# Patient Record
Sex: Female | Born: 1970 | Race: White | Hispanic: No | Marital: Married | Smoking: Never smoker
Health system: Southern US, Community
[De-identification: ages and names within clinical notes are randomized; demographics above are authoritative.]

## PROBLEM LIST (undated history)

## (undated) DIAGNOSIS — D509 Iron deficiency anemia, unspecified: Secondary | ICD-10-CM

## (undated) DIAGNOSIS — Z8619 Personal history of other infectious and parasitic diseases: Secondary | ICD-10-CM

## (undated) DIAGNOSIS — M1990 Unspecified osteoarthritis, unspecified site: Secondary | ICD-10-CM

## (undated) DIAGNOSIS — E7800 Pure hypercholesterolemia, unspecified: Secondary | ICD-10-CM

## (undated) DIAGNOSIS — I10 Essential (primary) hypertension: Secondary | ICD-10-CM

## (undated) DIAGNOSIS — T7840XA Allergy, unspecified, initial encounter: Secondary | ICD-10-CM

## (undated) HISTORY — DX: Iron deficiency anemia, unspecified: D50.9

## (undated) HISTORY — DX: Allergy, unspecified, initial encounter: T78.40XA

## (undated) HISTORY — DX: Essential (primary) hypertension: I10

## (undated) HISTORY — DX: Personal history of other infectious and parasitic diseases: Z86.19

---

## 2010-09-29 HISTORY — PX: KNEE SURGERY: SHX244

## 2018-07-23 LAB — HM MAMMOGRAPHY

## 2018-10-04 LAB — TSH: TSH: 2.53 (ref 0.41–5.90)

## 2018-10-04 LAB — COMPREHENSIVE METABOLIC PANEL
Albumin: 4.7 (ref 3.5–5.0)
Calcium: 9.5 (ref 8.7–10.7)

## 2018-10-04 LAB — BASIC METABOLIC PANEL
BUN: 13 (ref 4–21)
CO2: 23 — AB (ref 13–22)
Chloride: 106 (ref 99–108)
Creatinine: 0.8 (ref 0.5–1.1)
Glucose: 94
Potassium: 4.8 (ref 3.4–5.3)
Sodium: 143 (ref 137–147)

## 2018-10-04 LAB — CBC AND DIFFERENTIAL
HCT: 43 (ref 36–46)
Hemoglobin: 13.8 (ref 12.0–16.0)
WBC: 5.9

## 2018-10-04 LAB — LIPID PANEL
Cholesterol: 239 — AB (ref 0–200)
HDL: 64 (ref 35–70)
LDL Cholesterol: 147
Triglycerides: 142 (ref 40–160)

## 2018-10-04 LAB — CBC: RBC: 4.48 (ref 3.87–5.11)

## 2018-10-04 LAB — VITAMIN B12: Vitamin B-12: 249

## 2020-08-07 ENCOUNTER — Other Ambulatory Visit: Payer: Self-pay

## 2020-08-07 ENCOUNTER — Encounter: Payer: Self-pay | Admitting: Physician Assistant

## 2020-08-07 ENCOUNTER — Ambulatory Visit (INDEPENDENT_AMBULATORY_CARE_PROVIDER_SITE_OTHER): Payer: BC Managed Care – PPO | Admitting: Physician Assistant

## 2020-08-07 VITALS — BP 120/84 | HR 67 | Temp 97.5°F | Ht 63.5 in | Wt 211.0 lb

## 2020-08-07 DIAGNOSIS — I10 Essential (primary) hypertension: Secondary | ICD-10-CM

## 2020-08-07 DIAGNOSIS — Z23 Encounter for immunization: Secondary | ICD-10-CM

## 2020-08-07 DIAGNOSIS — R1011 Right upper quadrant pain: Secondary | ICD-10-CM

## 2020-08-07 LAB — CBC WITH DIFFERENTIAL/PLATELET
Absolute Monocytes: 340 cells/uL (ref 200–950)
Basophils Absolute: 32 cells/uL (ref 0–200)
Basophils Relative: 0.5 %
Eosinophils Absolute: 151 cells/uL (ref 15–500)
Eosinophils Relative: 2.4 %
HCT: 42.7 % (ref 35.0–45.0)
Hemoglobin: 14.4 g/dL (ref 11.7–15.5)
Lymphs Abs: 2073 cells/uL (ref 850–3900)
MCH: 30.8 pg (ref 27.0–33.0)
MCHC: 33.7 g/dL (ref 32.0–36.0)
MCV: 91.2 fL (ref 80.0–100.0)
MPV: 9.7 fL (ref 7.5–12.5)
Monocytes Relative: 5.4 %
Neutro Abs: 3704 cells/uL (ref 1500–7800)
Neutrophils Relative %: 58.8 %
Platelets: 270 10*3/uL (ref 140–400)
RBC: 4.68 10*6/uL (ref 3.80–5.10)
RDW: 12.2 % (ref 11.0–15.0)
Total Lymphocyte: 32.9 %
WBC: 6.3 10*3/uL (ref 3.8–10.8)

## 2020-08-07 LAB — LIPASE: Lipase: 20 U/L (ref 7–60)

## 2020-08-07 LAB — COMPREHENSIVE METABOLIC PANEL
AG Ratio: 1.9 (calc) (ref 1.0–2.5)
ALT: 15 U/L (ref 6–29)
AST: 15 U/L (ref 10–35)
Albumin: 4.5 g/dL (ref 3.6–5.1)
Alkaline phosphatase (APISO): 51 U/L (ref 31–125)
BUN: 14 mg/dL (ref 7–25)
CO2: 27 mmol/L (ref 20–32)
Calcium: 9.6 mg/dL (ref 8.6–10.2)
Chloride: 105 mmol/L (ref 98–110)
Creat: 0.86 mg/dL (ref 0.50–1.10)
Globulin: 2.4 g/dL (calc) (ref 1.9–3.7)
Glucose, Bld: 87 mg/dL (ref 65–99)
Potassium: 4.2 mmol/L (ref 3.5–5.3)
Sodium: 139 mmol/L (ref 135–146)
Total Bilirubin: 1 mg/dL (ref 0.2–1.2)
Total Protein: 6.9 g/dL (ref 6.1–8.1)

## 2020-08-07 MED ORDER — LISINOPRIL 40 MG PO TABS: 40.0000 mg | ORAL_TABLET | Freq: Every day | ORAL | 3 refills | Status: DC

## 2020-08-07 MED ORDER — HYDROCHLOROTHIAZIDE 12.5 MG PO CAPS: 12.5000 mg | ORAL_CAPSULE | Freq: Every day | ORAL | 3 refills | Status: DC

## 2020-08-07 NOTE — Progress Notes (Signed)
Christine Webb is a 49 y.o. female is here to establish care.  I acted as a Neurosurgeon for [employer], PA-C Corky Mull, LPN   History of Present Illness:   Chief Complaint  Patient presents with  . Establish Care  . Abdominal Pain    RUQ    HPI   Abdominal pain Pt c/o RUQ abdominal pain for years. States that if she laughs really hard, bends over a certain way, she has pain in that area. She said that she discussed this with her doctor in the past but they were unable to find the source of pain. She has not had any imaging done. Over the past 6 months, she has had a burning sensation in that area. Denies: radiation, no nausea or constipation. Does have heartburn related to foods that she eats, and this is remedied by changing diet and eating tums. Does not require other medications for her heartburn. Has had history of colonoscopy, family hx of colon cancer. The burning sensation lasts for a few minutes, not associated with eating.  HTN Currently taking Lisinopril 40 mg and HCTZ 12.5 mg. At home blood pressure readings are: not checked. Patient denies chest pain, SOB, blurred vision, dizziness, unusual headaches, lower leg swelling. Patient is compliant with medication. Denies excessive caffeine intake, stimulant usage, excessive alcohol intake, or increase in salt consumption.  BP Readings from Last 3 Encounters:  08/07/20 120/84      Health Maintenance Due  Topic Date Due  . Hepatitis C Screening  Never done  . HIV Screening  Never done  . TETANUS/TDAP  Never done  . PAP SMEAR-Modifier  Never done  . INFLUENZA VACCINE  Never done    Past Medical History:  Diagnosis Date  . Allergy   . History of chicken pox   . History of shingles   . Hypertension   . Iron deficiency anemia    Ablation; did require transfusions     Social History   Tobacco Use  . Smoking status: Never Smoker  . Smokeless tobacco: Never Used  Vaping Use  . Vaping Use: Never used   Substance Use Topics  . Alcohol use: Yes    Alcohol/week: 3.0 standard drinks    Types: 3 Glasses of wine per week  . Drug use: Never    Past Surgical History:  Procedure Laterality Date  . CESAREAN SECTION  2002, 2004  . KNEE SURGERY  2012   3 surgeries    Family History  Problem Relation Age of Onset  . Hypertension Mother   . Breast cancer Mother   . Colon cancer Mother   . Osteoarthritis Father   . Hypertension Father   . Skin cancer Brother   . Lung cancer Maternal Grandmother   . Kidney cancer Paternal Grandmother     PMHx, SurgHx, SocialHx, FamHx, Medications, and Allergies were reviewed in the Visit Navigator and updated as appropriate.   Patient Active Problem List   Diagnosis Date Noted  . Hypertension     Social History   Tobacco Use  . Smoking status: Never Smoker  . Smokeless tobacco: Never Used  Vaping Use  . Vaping Use: Never used  Substance Use Topics  . Alcohol use: Yes    Alcohol/week: 3.0 standard drinks    Types: 3 Glasses of wine per week  . Drug use: Never    Current Medications and Allergies:    Current Outpatient Medications:  .  cetirizine (ZYRTEC) 10 MG tablet, Take 10  mg by mouth daily., Disp: , Rfl:  .  hydrochlorothiazide (MICROZIDE) 12.5 MG capsule, Take 1 capsule (12.5 mg total) by mouth daily., Disp: 90 capsule, Rfl: 3 .  lisinopril (ZESTRIL) 40 MG tablet, Take 1 tablet (40 mg total) by mouth daily., Disp: 90 tablet, Rfl: 3  No Known Allergies  Review of Systems   ROS Negative unless otherwise specified per HPI.  Vitals:   Vitals:   08/07/20 0824  BP: 120/84  Pulse: 67  Temp: (!) 97.5 F (36.4 C)  TempSrc: Temporal  SpO2: 97%  Weight: 211 lb (95.7 kg)  Height: 5' 3.5" (1.613 m)     Body mass index is 36.79 kg/m.   Physical Exam:    Physical Exam Vitals and nursing note reviewed.  Constitutional:      General: She is not in acute distress.    Appearance: She is well-developed. She is not  ill-appearing or toxic-appearing.  Cardiovascular:     Rate and Rhythm: Normal rate and regular rhythm.     Pulses: Normal pulses.     Heart sounds: Normal heart sounds, S1 normal and S2 normal.     Comments: No LE edema Pulmonary:     Effort: Pulmonary effort is normal.     Breath sounds: Normal breath sounds.  Abdominal:     General: Abdomen is flat. Bowel sounds are normal.     Palpations: Abdomen is soft.     Tenderness: There is no abdominal tenderness. There is no right CVA tenderness or left CVA tenderness.  Skin:    General: Skin is warm and dry.  Neurological:     Mental Status: She is alert.     GCS: GCS eye subscore is 4. GCS verbal subscore is 5. GCS motor subscore is 6.  Psychiatric:        Speech: Speech normal.        Behavior: Behavior normal. Behavior is cooperative.      Assessment and Plan:    [name] was seen today for establish care and abdominal pain.  Diagnoses and all orders for this visit:  Colicky RUQ abdominal pain No red flags on exam. Unclear etiology -- will obtain imaging to further work-up. Possible gallbladder etiology? Will update labs today as well. Follow-up based on results and clinical response. -     CBC with Differential/Platelet; Future -     Comprehensive metabolic panel; Future -     Lipase; Future -     US ABDOMEN LIMITED RUQ (LIVER/GB); Future -     Lipase -     Comprehensive metabolic panel -     CBC with Differential/Platelet  Primary hypertension Well controlled. Will refill lisinopril 40 mg and hctz 12.5 mg. Follow-up in 6 months.  Other orders -     hydrochlorothiazide (MICROZIDE) 12.5 MG capsule; Take 1 capsule (12.5 mg total) by mouth daily. -     lisinopril (ZESTRIL) 40 MG tablet; Take 1 tablet (40 mg total) by mouth daily.  CMA or LPN served as scribe during this visit. History, Physical, and Plan performed by medical provider. The above documentation has been reviewed and is accurate and  complete.   Jarold Motto, PA-C [place] 08/07/2020  Follow-up: No follow-ups on file.

## 2020-08-07 NOTE — Addendum Note (Signed)
Addended by: Jimmye Norman on: 08/07/2020 11:25 AM   Modules accepted: Orders

## 2020-08-07 NOTE — Patient Instructions (Signed)
It was great to see you!  We are going to update your labs today.  We are also going to get you scheduled for an abdominal ultrasound.  After these results are in we will discuss next steps and follow-up.  Take care,  Jarold Motto PA-C

## 2020-08-10 ENCOUNTER — Telehealth: Payer: Self-pay

## 2020-08-10 NOTE — Telephone Encounter (Signed)
See result notes. 

## 2020-08-10 NOTE — Telephone Encounter (Signed)
Patient called back regarding lab results  

## 2020-08-29 ENCOUNTER — Other Ambulatory Visit: Payer: BC Managed Care – PPO

## 2020-08-30 ENCOUNTER — Ambulatory Visit
Admission: RE | Admit: 2020-08-30 | Discharge: 2020-08-30 | Disposition: A | Discharge status: Home / Self Care | Payer: BC Managed Care – PPO | Source: Ambulatory Visit | Attending: Physician Assistant | Admitting: Physician Assistant

## 2020-08-30 DIAGNOSIS — R1011 Right upper quadrant pain: Secondary | ICD-10-CM

## 2020-08-30 DIAGNOSIS — K8020 Calculus of gallbladder without cholecystitis without obstruction: Secondary | ICD-10-CM | POA: Diagnosis not present

## 2020-09-03 ENCOUNTER — Telehealth: Payer: Self-pay

## 2020-09-03 NOTE — Telephone Encounter (Signed)
Pt would like to know if someone could call her about the labs she was taken at the imaging center. She has an appt to discuss them, but wants to know if she needs to jump on this sooner.

## 2020-09-04 NOTE — Telephone Encounter (Signed)
Appt scheduled to discuss results of U/S.

## 2020-09-11 ENCOUNTER — Other Ambulatory Visit: Payer: Self-pay

## 2020-09-11 ENCOUNTER — Ambulatory Visit (INDEPENDENT_AMBULATORY_CARE_PROVIDER_SITE_OTHER): Payer: BC Managed Care – PPO | Admitting: Physician Assistant

## 2020-09-11 ENCOUNTER — Encounter: Payer: Self-pay | Admitting: Physician Assistant

## 2020-09-11 VITALS — BP 120/80 | HR 65 | Temp 97.4°F | Ht 63.5 in | Wt 211.0 lb

## 2020-09-11 DIAGNOSIS — R1011 Right upper quadrant pain: Secondary | ICD-10-CM

## 2020-09-11 DIAGNOSIS — T753XXA Motion sickness, initial encounter: Secondary | ICD-10-CM

## 2020-09-11 MED ORDER — MECLIZINE HCL 12.5 MG PO TABS: 12.5000 mg | ORAL_TABLET | Freq: Three times a day (TID) | ORAL | 0 refills | Status: AC | PRN

## 2020-09-11 MED ORDER — ONDANSETRON HCL 4 MG PO TABS: 4.0000 mg | ORAL_TABLET | Freq: Three times a day (TID) | ORAL | 1 refills | Status: DC | PRN

## 2020-09-11 MED ORDER — SCOPOLAMINE 1 MG/3DAYS TD PT72: 1.0000 | MEDICATED_PATCH | TRANSDERMAL | 0 refills | Status: DC

## 2020-09-11 NOTE — Progress Notes (Signed)
Christine Webb is a 49 y.o. female is here to discuss: Ultrasound results  I acted as a Neurosurgeon for [employer], PA-C Corky Mull, LPN   History of Present Illness:   Chief Complaint  Patient presents with  . Ultrasound results  . Abdominal Pain    RUQ    HPI   RUQ pain Last seen by me on 08/07/20 for colicky RUQ pain. Labs were normal but U/S revealed cholelithiasis and fatty infiltration of liver.  She had an episode this past Saturday, she was having RUQ pain radiating to mid back area and lasted for 6 hours. Pain was severe. Had nausea but no vomiting. She was eating "unhealthy" food while watching football.  She denies: fevers, chills, malaise, bowel changes, rectal bleeding  Motion-Sickness She is planning to go on a cruise within the next two weeks. She has hx of motion sickness and would like meclizine in case her symptoms flare up.    Health Maintenance Due  Topic Date Due  . Hepatitis C Screening  Never done  . HIV Screening  Never done  . PAP SMEAR-Modifier  Never done    Past Medical History:  Diagnosis Date  . Allergy   . History of chicken pox   . History of shingles   . Hypertension   . Iron deficiency anemia    Ablation; did require transfusions     Social History   Tobacco Use  . Smoking status: Never Smoker  . Smokeless tobacco: Never Used  Vaping Use  . Vaping Use: Never used  Substance Use Topics  . Alcohol use: Yes    Alcohol/week: 3.0 standard drinks    Types: 3 Glasses of wine per week  . Drug use: Never    Past Surgical History:  Procedure Laterality Date  . CESAREAN SECTION  2002, 2004  . KNEE SURGERY  2012   3 surgeries    Family History  Problem Relation Age of Onset  . Hypertension Mother   . Breast cancer Mother   . Colon cancer Mother   . Osteoarthritis Father   . Hypertension Father   . Skin cancer Brother   . Lung cancer Maternal Grandmother   . Kidney cancer Paternal Grandmother     PMHx, SurgHx,  SocialHx, FamHx, Medications, and Allergies were reviewed in the Visit Navigator and updated as appropriate.   Patient Active Problem List   Diagnosis Date Noted  . Hypertension     Social History   Tobacco Use  . Smoking status: Never Smoker  . Smokeless tobacco: Never Used  Vaping Use  . Vaping Use: Never used  Substance Use Topics  . Alcohol use: Yes    Alcohol/week: 3.0 standard drinks    Types: 3 Glasses of wine per week  . Drug use: Never    Current Medications and Allergies:    Current Outpatient Medications:  .  cetirizine (ZYRTEC) 10 MG tablet, Take 10 mg by mouth daily., Disp: , Rfl:  .  hydrochlorothiazide (MICROZIDE) 12.5 MG capsule, Take 1 capsule (12.5 mg total) by mouth daily., Disp: 90 capsule, Rfl: 3 .  lisinopril (ZESTRIL) 40 MG tablet, Take 1 tablet (40 mg total) by mouth daily., Disp: 90 tablet, Rfl: 3 .  meclizine (ANTIVERT) 12.5 MG tablet, Take 1 tablet (12.5 mg total) by mouth 3 (three) times daily as needed for dizziness., Disp: 30 tablet, Rfl: 0 .  ondansetron (ZOFRAN) 4 MG tablet, Take 1 tablet (4 mg total) by mouth every 8 (eight)  hours as needed for nausea or vomiting., Disp: 30 tablet, Rfl: 1 .  scopolamine (TRANSDERM-SCOP) 1 MG/3DAYS, Place 1 patch (1.5 mg total) onto the skin every 3 (three) days. Start >4 hours prior to event., Disp: 10 patch, Rfl: 0  No Known Allergies  Review of Systems   ROS Negative unless otherwise specified per HPI.  Vitals:   Vitals:   09/11/20 1408  BP: 120/80  Pulse: 65  Temp: (!) 97.4 F (36.3 C)  TempSrc: Temporal  SpO2: 97%  Weight: 211 lb (95.7 kg)  Height: 5' 3.5" (1.613 m)     Body mass index is 36.79 kg/m.   Physical Exam:    Physical Exam Vitals and nursing note reviewed.  Constitutional:      General: She is not in acute distress.    Appearance: She is well-developed. She is not ill-appearing, toxic-appearing or sickly-appearing.  Cardiovascular:     Rate and Rhythm: Normal rate and  regular rhythm.     Pulses: Normal pulses.     Heart sounds: Normal heart sounds, S1 normal and S2 normal.     Comments: No LE edema Pulmonary:     Effort: Pulmonary effort is normal.     Breath sounds: Normal breath sounds.  Skin:    General: Skin is warm, dry and intact.  Neurological:     Mental Status: She is alert.     GCS: GCS eye subscore is 4. GCS verbal subscore is 5. GCS motor subscore is 6.  Psychiatric:        Mood and Affect: Mood and affect normal.        Speech: Speech normal.        Behavior: Behavior normal. Behavior is cooperative.      Assessment and Plan:    [name] was seen today for ultrasound results and abdominal pain.  Diagnoses and all orders for this visit:  Colicky RUQ abdominal pain Given ongoing intermittent symptoms, will refer to [place] [facility] for  Possible elective gallbladder removal. I have given her zofran to use prn for her trip. Recommend low-fat diet. Worsening precautions advised.  Motion sickness, initial encounter I have prescribed both scopolamine patches and meclizine, depending on which one her insurance pays for.  Other orders -     ondansetron (ZOFRAN) 4 MG tablet; Take 1 tablet (4 mg total) by mouth every 8 (eight) hours as needed for nausea or vomiting. -     scopolamine (TRANSDERM-SCOP) 1 MG/3DAYS; Place 1 patch (1.5 mg total) onto the skin every 3 (three) days. Start >4 hours prior to event. -     meclizine (ANTIVERT) 12.5 MG tablet; Take 1 tablet (12.5 mg total) by mouth 3 (three) times daily as needed for dizziness.  CMA or LPN served as scribe during this visit. History, Physical, and Plan performed by medical provider. The above documentation has been reviewed and is accurate and complete.   Jarold Motto, PA-C [place] 09/11/2020  Follow-up: No follow-ups on file.

## 2020-09-11 NOTE — Patient Instructions (Addendum)
It was great to see you!  I have sent in nausea medication for you as well as dizziness patches and meclizine should you need it for your trip.  Call [facility]: [phone] -- call if you haven't heard anything in the next few weeks.   Gallbladder Eating Plan If you have a gallbladder condition, you may have trouble digesting fats. Eating a low-fat diet can help reduce your symptoms, and may be helpful before and after having surgery to remove your gallbladder (cholecystectomy). Your health care provider may recommend that you work with a diet and nutrition specialist (dietitian) to help you reduce the amount of fat in your diet. What are tips for following this plan? General guidelines  Limit your fat intake to less than 30% of your total daily calories. If you eat around 1,800 calories each day, this is less than 60 grams (g) of fat per day.  Fat is an important part of a healthy diet. Eating a low-fat diet can make it hard to maintain a healthy body weight. Ask your dietitian how much fat, calories, and other nutrients you need each day.  Eat small, frequent meals throughout the day instead of three large meals.  Drink at least 8-10 cups of fluid a day. Drink enough fluid to keep your urine clear or pale yellow.  Limit alcohol intake to no more than 1 drink a day for nonpregnant women and 2 drinks a day for men. One drink equals 12 oz of beer, 5 oz of wine, or 1 oz of hard liquor. Reading food labels  Check Nutrition Facts on food labels for the amount of fat per serving. Choose foods with less than 3 grams of fat per serving. Shopping  Choose nonfat and low-fat healthy foods. Look for the words "nonfat," "low fat," or "fat free."  Avoid buying processed or prepackaged foods. Cooking  Cook using low-fat methods, such as baking, broiling, grilling, or boiling.  Cook with small amounts of healthy fats, such as olive oil, grapeseed oil, canola oil, or sunflower  oil. What foods are recommended?   All fresh, frozen, or canned fruits and vegetables.  Whole grains.  Low-fat or non-fat (skim) milk and yogurt.  Lean meat, skinless poultry, fish, eggs, and beans.  Low-fat protein supplement powders or drinks.  Spices and herbs. What foods are not recommended?  High-fat foods. These include baked goods, fast food, fatty cuts of meat, ice cream, french toast, sweet rolls, pizza, cheese bread, foods covered with butter, creamy sauces, or cheese.  Fried foods. These include french fries, tempura, battered fish, breaded chicken, fried breads, and sweets.  Foods with strong odors.  Foods that cause bloating and gas. Summary  A low-fat diet can be helpful if you have a gallbladder condition, or before and after gallbladder surgery.  Limit your fat intake to less than 30% of your total daily calories. This is about 60 g of fat if you eat 1,800 calories each day.  Eat small, frequent meals throughout the day instead of three large meals.  Contact a doctor if: 1. You have sudden pain in the upper right side of your belly (abdomen). Pain might spread to your right shoulder or your chest. This may be a sign of a gallbladder attack. 2. You feel sick to your stomach (are nauseous). 3. You throw up (vomit). 4. You have been diagnosed with gallstones that have no symptoms and you get: ? Belly pain. ? Discomfort, burning, or fullness in the upper  part of your belly (indigestion). Get help right away if:  You have sudden pain in the upper right side of your belly, and it lasts for more than 2 hours.  You have belly pain that lasts for more than 5 hours.  You have a fever or chills.  You keep feeling sick to your stomach or you keep throwing up.  Your skin or the whites of your eyes turn yellow (jaundice).  You have dark-colored pee (urine).  You have light-colored poop (stool).

## 2020-09-14 ENCOUNTER — Encounter: Payer: Self-pay | Admitting: Physician Assistant

## 2020-10-31 ENCOUNTER — Ambulatory Visit: Payer: Self-pay | Admitting: Surgery

## 2020-10-31 DIAGNOSIS — K8010 Calculus of gallbladder with chronic cholecystitis without obstruction: Secondary | ICD-10-CM | POA: Diagnosis not present

## 2020-11-29 NOTE — Patient Instructions (Addendum)
DUE TO COVID-19 ONLY ONE VISITOR IS ALLOWED TO COME WITH YOU AND STAY IN THE WAITING ROOM ONLY DURING PRE OP AND PROCEDURE DAY OF SURGERY. THE 1 VISITOR  MAY VISIT WITH YOU AFTER SURGERY IN YOUR PRIVATE ROOM DURING VISITING HOURS ONLY!  YOU NEED TO HAVE A COVID 19 TEST ON: 12/05/20 @  3:05 PM , THIS TEST MUST BE DONE BEFORE SURGERY,  COVID TESTING SITE [address] [place] [address], IT IS ON THE RIGHT GOING OUT [address] APPROXIMATELY  2 MINUTES PAST [employer] ON THE RIGHT. ONCE YOUR COVID TEST IS COMPLETED,  PLEASE BEGIN THE QUARANTINE INSTRUCTIONS AS OUTLINED IN YOUR HANDOUT.                Nilah Belcourt   Your procedure is scheduled on: 12/07/20   Report to [facility] Main  Entrance   Report to short stay at: 5:30 AM     Call this number if you have problems the morning of surgery [phone]    Remember: Do not eat food or drink liquids :After Midnight.   BRUSH YOUR TEETH MORNING OF SURGERY AND RINSE YOUR MOUTH OUT, NO CHEWING GUM CANDY OR MINTS.    Take these medicines the morning of surgery with A SIP OF WATER: cetirizine as needed.                                You may not have any metal on your body including hair pins and              piercings  Do not wear jewelry, make-up, lotions, powders or perfumes, deodorant             Do not wear nail polish on your fingernails.  Do not shave  48 hours prior to surgery.            Do not bring valuables to the hospital. [facility] IS NOT             RESPONSIBLE   FOR VALUABLES.  Contacts, dentures or bridgework may not be worn into surgery.  Leave suitcase in the car. After surgery it may be brought to your room.     Patients discharged the day of surgery will not be allowed to drive home. IF YOU ARE HAVING SURGERY AND GOING HOME THE SAME DAY, YOU MUST HAVE AN ADULT TO DRIVE YOU HOME AND BE WITH YOU FOR 24 HOURS. YOU MAY GO HOME BY TAXI OR UBER OR ORTHERWISE, BUT AN ADULT MUST ACCOMPANY YOU  HOME AND STAY WITH YOU FOR 24 HOURS.  Name and phone number of your driver:  Special Instructions: N/A              Please read over the following fact sheets you were given: _____________________________________________________________________        [facility] - Preparing for Surgery Before surgery, you can play an important role.  Because skin is not sterile, your skin needs to be as free of germs as possible.  You can reduce the number of germs on your skin by washing with CHG (chlorahexidine gluconate) soap before surgery.  CHG is an antiseptic cleaner which kills germs and bonds with the skin to continue killing germs even after washing. Please DO NOT use if you have an allergy to CHG or antibacterial soaps.  If your skin becomes reddened/irritated stop using the CHG and inform  your nurse when you arrive at Short Stay. Do not shave (including legs and underarms) for at least 48 hours prior to the first CHG shower.  You may shave your face/neck. Please follow these instructions carefully:  1.  Shower with CHG Soap the night before surgery and the  morning of Surgery.  2.  If you choose to wash your hair, wash your hair first as usual with your  normal  shampoo.  3.  After you shampoo, rinse your hair and body thoroughly to remove the  shampoo.                           4.  Use CHG as you would any other liquid soap.  You can apply chg directly  to the skin and wash                       Gently with a scrungie or clean washcloth.  5.  Apply the CHG Soap to your body ONLY FROM THE NECK DOWN.   Do not use on face/ open                           Wound or open sores. Avoid contact with eyes, ears mouth and genitals (private parts).                       Wash face,  Genitals (private parts) with your normal soap.             6.  Wash thoroughly, paying special attention to the area where your surgery  will be performed.  7.  Thoroughly rinse your body with warm water from the neck down.  8.  DO  NOT shower/wash with your normal soap after using and rinsing off  the CHG Soap.                9.  Pat yourself dry with a clean towel.            10.  Wear clean pajamas.            11.  Place clean sheets on your bed the night of your first shower and do not  sleep with pets. Day of Surgery : Do not apply any lotions/deodorants the morning of surgery.  Please wear clean clothes to the hospital/surgery center.  FAILURE TO FOLLOW THESE INSTRUCTIONS MAY RESULT IN THE CANCELLATION OF YOUR SURGERY PATIENT SIGNATURE_________________________________  NURSE SIGNATURE__________________________________  ________________________________________________________________________

## 2020-11-30 ENCOUNTER — Encounter (HOSPITAL_COMMUNITY): Payer: Self-pay

## 2020-11-30 ENCOUNTER — Other Ambulatory Visit: Payer: Self-pay

## 2020-11-30 ENCOUNTER — Encounter (HOSPITAL_COMMUNITY)
Admission: RE | Admit: 2020-11-30 | Discharge: 2020-11-30 | Disposition: A | Discharge status: Home / Self Care | Payer: BC Managed Care – PPO | Source: Ambulatory Visit | Attending: Surgery | Admitting: Surgery

## 2020-11-30 DIAGNOSIS — Z01818 Encounter for other preprocedural examination: Secondary | ICD-10-CM | POA: Insufficient documentation

## 2020-11-30 HISTORY — DX: Unspecified osteoarthritis, unspecified site: M19.90

## 2020-11-30 LAB — CBC
HCT: 43.1 % (ref 36.0–46.0)
Hemoglobin: 14.5 g/dL (ref 12.0–15.0)
MCH: 31.1 pg (ref 26.0–34.0)
MCHC: 33.6 g/dL (ref 30.0–36.0)
MCV: 92.5 fL (ref 80.0–100.0)
Platelets: 285 10*3/uL (ref 150–400)
RBC: 4.66 MIL/uL (ref 3.87–5.11)
RDW: 12.4 % (ref 11.5–15.5)
WBC: 6.3 10*3/uL (ref 4.0–10.5)
nRBC: 0 % (ref 0.0–0.2)

## 2020-11-30 LAB — BASIC METABOLIC PANEL
Anion gap: 8 (ref 5–15)
BUN: 12 mg/dL (ref 6–20)
CO2: 24 mmol/L (ref 22–32)
Calcium: 9.3 mg/dL (ref 8.9–10.3)
Chloride: 107 mmol/L (ref 98–111)
Creatinine, Ser: 0.81 mg/dL (ref 0.44–1.00)
GFR, Estimated: >60 mL/min (ref >60)
Glucose, Bld: 94 mg/dL (ref 70–99)
Potassium: 3.7 mmol/L (ref 3.5–5.1)
Sodium: 139 mmol/L (ref 135–145)

## 2020-11-30 NOTE — Progress Notes (Addendum)
COVID Vaccine Completed: Yes Date COVID Vaccine completed: 01/03/20 COVID vaccine manufacturer: Pfizer     PCP - Jarold Motto: PA.  LOV: 09/11/20 Cardiologist -   Chest x-ray -  EKG -  Stress Test -  ECHO -  Cardiac Cath -  Pacemaker/ICD device last checked:  Sleep Study -  CPAP -   Fasting Blood Sugar -  Checks Blood Sugar _____ times a day  Blood Thinner Instructions: Aspirin Instructions: Last Dose:  Anesthesia review:   Patient denies shortness of breath, fever, cough and chest pain at PAT appointment   Patient verbalized understanding of instructions that were given to them at the PAT appointment. Patient was also instructed that they will need to review over the PAT instructions again at home before surgery.

## 2020-12-02 ENCOUNTER — Encounter (HOSPITAL_COMMUNITY): Payer: Self-pay | Admitting: Surgery

## 2020-12-02 DIAGNOSIS — K8010 Calculus of gallbladder with chronic cholecystitis without obstruction: Secondary | ICD-10-CM | POA: Diagnosis present

## 2020-12-02 NOTE — H&P (Signed)
General Surgery [facility]  Reubin [name] DOB: [birth year] Married / Language: [place] / Race: White Female   History of Present Illness   The patient is a 50 year old female who presents for evaluation of gall stones.  CHIEF COMPLAINT: chronic cholecystitis, cholelithiasis  Patient is referred by Jarold Motto, PA, for surgical evaluation and management of symptomatic cholelithiasis and chronic cholecystitis. Patient has had symptoms intermittently over the past 70 years. Symptoms have been more prominent over the past year. She has had at least 4 attacks with significant epigastric abdominal pain radiating to the back associated with nausea which lasted up to 4 hours. She denies any history of jaundice or acholic stools. She denies any history of pancreatitis or hepatitis. Her only previous abdominal surgery was a cesarean section. Patient underwent an ultrasound on August 30, 2020. This showed multiple gallstones without biliary dilatation or acute inflammatory signs. There was evidence of fatty infiltration of the liver. There is a family history of gallbladder disease in the patient's brother who had cholecystectomy. 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 She is originally from [place].   Past Surgical History Cesarean Section - Multiple  Knee Surgery  Right.  Diagnostic Studies History  Colonoscopy  1-5 years ago Mammogram  1-3 years ago Pap Smear  1-5 years ago  Allergies  Morphine Sulfate ER *ANALGESICS - OPIOID*  Allergies Reconciled   Medication History  hydroCHLOROthiazide (12.5MG  Capsule, Oral) Active. Lisinopril (40MG  Tablet, Oral) Active. Meclizine HCl (12.5MG  Tablet, Oral) Active. Ondansetron HCl (4MG  Tablet, Oral) Active. Scopolamine (1MG /3DAYS Patch 72HR, Transdermal) Active. Medications Reconciled  Social History  Alcohol use  Moderate alcohol use. Caffeine use  Coffee. No drug use  Tobacco  use  Never smoker.  Family History  Arthritis  Father, Mother. Breast Cancer  Mother. Colon Cancer  Mother. Heart Disease  Mother. Heart disease in female family member before age 52  Hypertension  Father, Mother.  Pregnancy / Birth History  Age at menarche  12 years. Gravida  2 Irregular periods  Length (months) of breastfeeding  3-6 Maternal age  21-30 Para  3  Other Problems  Arthritis  Back Pain  Gastroesophageal Reflux Disease  High blood pressure   Review of Systems  General Not Present- Appetite Loss, Chills, Fatigue, Fever, Night Sweats, Weight Gain and Weight Loss. Skin Not Present- Change in Wart/Mole, Dryness, Hives, Jaundice, New Lesions, Non-Healing Wounds, Rash and Ulcer. HEENT Not Present- Earache, Hearing Loss, Hoarseness, Nose Bleed, Oral Ulcers, Ringing in the Ears, Seasonal Allergies, Sinus Pain, Sore Throat, Visual Disturbances, Wears glasses/contact lenses and Yellow Eyes. Respiratory Present- Snoring. Not Present- Bloody sputum, Chronic Cough, Difficulty Breathing and Wheezing. Cardiovascular Not Present- Chest Pain, Difficulty Breathing Lying Down, Leg Cramps, Palpitations, Rapid Heart Rate, Shortness of Breath and Swelling of Extremities. Gastrointestinal Present- Change in Bowel Habits and Indigestion. Not Present- Abdominal Pain, Bloating, Bloody Stool, Chronic diarrhea, Constipation, Difficulty Swallowing, Excessive gas, Gets full quickly at meals, Hemorrhoids, Nausea, Rectal Pain and Vomiting. Female Genitourinary Not Present- Frequency, Nocturia, Painful Urination, Pelvic Pain and Urgency. Musculoskeletal Present- Joint Pain and Joint Stiffness. Not Present- Back Pain, Muscle Pain, Muscle Weakness and Swelling of Extremities. Neurological Not Present- Decreased Memory, Fainting, Headaches, Numbness, Seizures, Tingling, Tremor, Trouble walking and Weakness. Psychiatric Not Present- Anxiety, Bipolar, Change in Sleep Pattern, Depression,  Fearful and Frequent crying. Endocrine Not Present- Cold Intolerance, Excessive Hunger, Hair Changes, Heat Intolerance, Hot flashes and New Diabetes. Hematology Not Present- Blood Thinners, Easy Bruising, Excessive  bleeding, Gland problems, HIV and Persistent Infections.  Vitals  Weight: 215.38 lb Height: 61in Body Surface Area: 1.95 m Body Mass Index: 40.69 kg/m  Temp.: 97.79F  Pulse: 87 (Regular)  P.OX: 99% (Room air) BP: 120/80(Sitting, Left Arm, Standard)  Physical Exam   GENERAL APPEARANCE Development: normal Nutritional status: normal Gross deformities: none  SKIN Rash, lesions, ulcers: none Induration, erythema: none Nodules: none palpable  EYES Conjunctiva and lids: normal Pupils: equal and reactive Iris: normal bilaterally  EARS, NOSE, MOUTH, THROAT External ears: no lesion or deformity External nose: no lesion or deformity Hearing: grossly normal Due to Covid-19 pandemic, patient is wearing a mask.  NECK Symmetric: yes Trachea: midline Thyroid: no palpable nodules in the thyroid bed  CHEST Respiratory effort: normal Retraction or accessory muscle use: no Breath sounds: normal bilaterally Rales, rhonchi, wheeze: none  CARDIOVASCULAR Auscultation: regular rhythm, normal rate Murmurs: none Pulses: radial pulse 2+ palpable Lower extremity edema: none  ABDOMEN Distension: none Masses: none palpable Tenderness: none Hepatosplenomegaly: not present Hernia: not present  MUSCULOSKELETAL Station and gait: normal Digits and nails: no clubbing or cyanosis Muscle strength: grossly normal all extremities Range of motion: grossly normal all extremities Deformity: none  LYMPHATIC Cervical: none palpable Supraclavicular: none palpable  PSYCHIATRIC Oriented to person, place, and time: yes Mood and affect: normal for situation Judgment and insight: appropriate for situation   Assessment & Plan   CALCULUS OF GALLBLADDER WITH CHRONIC  CHOLECYSTITIS WITHOUT OBSTRUCTION (K80.10)  Patient is referred by her primary care provider for surgical evaluation of symptomatic cholelithiasis and chronic cholecystitis. Patient is provided with written literature on gallbladder surgery to review at home.  Patient has had intermittent symptoms for several years. She has had more prominent symptoms over the past year. Ultrasound documents multiple gallstones. We discussed proceeding with laparoscopic cholecystectomy. We will plan intraoperative cholangiography. We discussed the hospital stay to be anticipated and her postoperative recovery. We discussed the small potential risk of open surgery. She understands and wishes to proceed with surgery in the near future.  Due to the current virus pandemic, operating room scheduling is limited. Our office will contact her as soon as we are able to schedule a date for her procedure.  The risks and benefits of the procedure have been discussed at length with the patient. The patient understands the proposed procedure, potential alternative treatments, and the course of recovery to be expected. All of the patient's questions have been answered at this time. The patient wishes to proceed with surgery.  Darnell Level, MD [facility] Office: [phone]

## 2020-12-04 ENCOUNTER — Other Ambulatory Visit (HOSPITAL_COMMUNITY): Payer: BC Managed Care – PPO

## 2020-12-05 ENCOUNTER — Other Ambulatory Visit (HOSPITAL_COMMUNITY)
Admission: RE | Admit: 2020-12-05 | Discharge: 2020-12-05 | Disposition: A | Discharge status: Home / Self Care | Payer: BC Managed Care – PPO | Source: Ambulatory Visit | Attending: Surgery | Admitting: Surgery

## 2020-12-05 DIAGNOSIS — Z20822 Contact with and (suspected) exposure to covid-19: Secondary | ICD-10-CM | POA: Diagnosis not present

## 2020-12-05 DIAGNOSIS — Z01812 Encounter for preprocedural laboratory examination: Secondary | ICD-10-CM | POA: Diagnosis not present

## 2020-12-05 LAB — SARS CORONAVIRUS 2 (TAT 6-24 HRS): SARS Coronavirus 2: NEGATIVE

## 2020-12-06 NOTE — Anesthesia Preprocedure Evaluation (Addendum)
Anesthesia Evaluation  Patient identified by MRN, date of birth, ID band Patient awake    Reviewed: Allergy & Precautions, NPO status , Patient's Chart, lab work & pertinent test results  History of Anesthesia Complications Negative for: history of anesthetic complications  Airway Mallampati: II  TM Distance: >3 FB Neck ROM: Full    Dental  (+) Dental Advisory Given, Teeth Intact   Pulmonary neg pulmonary ROS,    Pulmonary exam normal        Cardiovascular hypertension, Pt. on medications Normal cardiovascular exam     Neuro/Psych negative neurological ROS  negative psych ROS   GI/Hepatic negative GI ROS, Neg liver ROS,   Endo/Other   Obesity   Renal/GU negative Renal ROS     Musculoskeletal  (+) Arthritis ,   Abdominal (+) + obese,   Peds  Hematology negative hematology ROS (+)   Anesthesia Other Findings Obesity   Reproductive/Obstetrics                            Anesthesia Physical Anesthesia Plan  ASA: II  Anesthesia Plan: General   Post-op Pain Management:    Induction: Intravenous  PONV Risk Score and Plan: 4 or greater and Treatment may vary due to age or medical condition, Ondansetron, Midazolam, Scopolamine patch - Pre-op and Dexamethasone  Airway Management Planned: Oral ETT  Additional Equipment: None  Intra-op Plan:   Post-operative Plan: Extubation in OR  Informed Consent: I have reviewed the patients History and Physical, chart, labs and discussed the procedure including the risks, benefits and alternatives for the proposed anesthesia with the patient or authorized representative who has indicated his/her understanding and acceptance.     Dental advisory given  Plan Discussed with: CRNA and Anesthesiologist  Anesthesia Plan Comments:        Anesthesia Quick Evaluation

## 2020-12-07 ENCOUNTER — Ambulatory Visit (HOSPITAL_COMMUNITY): Payer: BC Managed Care – PPO | Admitting: Certified Registered"

## 2020-12-07 ENCOUNTER — Ambulatory Visit (HOSPITAL_COMMUNITY)
Admission: RE | Admit: 2020-12-07 | Discharge: 2020-12-08 | Disposition: A | Discharge status: Home / Self Care | Payer: BC Managed Care – PPO | Attending: Surgery | Admitting: Surgery

## 2020-12-07 ENCOUNTER — Encounter (HOSPITAL_COMMUNITY): Payer: Self-pay | Admitting: Surgery

## 2020-12-07 ENCOUNTER — Ambulatory Visit (HOSPITAL_COMMUNITY): Payer: BC Managed Care – PPO

## 2020-12-07 ENCOUNTER — Encounter (HOSPITAL_COMMUNITY)
Admission: RE | Disposition: A | Discharge status: Home / Self Care | Payer: Self-pay | Source: Home / Self Care | Attending: Surgery

## 2020-12-07 DIAGNOSIS — Z79899 Other long term (current) drug therapy: Secondary | ICD-10-CM | POA: Diagnosis not present

## 2020-12-07 DIAGNOSIS — Z9049 Acquired absence of other specified parts of digestive tract: Secondary | ICD-10-CM | POA: Diagnosis not present

## 2020-12-07 DIAGNOSIS — Z885 Allergy status to narcotic agent status: Secondary | ICD-10-CM | POA: Insufficient documentation

## 2020-12-07 DIAGNOSIS — K8010 Calculus of gallbladder with chronic cholecystitis without obstruction: Secondary | ICD-10-CM | POA: Insufficient documentation

## 2020-12-07 DIAGNOSIS — D509 Iron deficiency anemia, unspecified: Secondary | ICD-10-CM | POA: Diagnosis not present

## 2020-12-07 DIAGNOSIS — I10 Essential (primary) hypertension: Secondary | ICD-10-CM | POA: Diagnosis not present

## 2020-12-07 DIAGNOSIS — Z419 Encounter for procedure for purposes other than remedying health state, unspecified: Secondary | ICD-10-CM

## 2020-12-07 HISTORY — PX: CHOLECYSTECTOMY: SHX55

## 2020-12-07 LAB — PREGNANCY, URINE: Preg Test, Ur: NEGATIVE

## 2020-12-07 SURGERY — LAPAROSCOPIC CHOLECYSTECTOMY WITH INTRAOPERATIVE CHOLANGIOGRAM
Anesthesia: General | Site: Abdomen

## 2020-12-07 MED ORDER — LIDOCAINE HCL 2 % IJ SOLN
INTRAMUSCULAR | Status: AC
  Filled 2020-12-07: qty 20

## 2020-12-07 MED ORDER — ACETAMINOPHEN 325 MG PO TABS: 650.0000 mg | ORAL_TABLET | Freq: Four times a day (QID) | ORAL | Status: DC | PRN

## 2020-12-07 MED ORDER — LIDOCAINE 2% (20 MG/ML) 5 ML SYRINGE
INTRAMUSCULAR | Status: AC
  Filled 2020-12-07: qty 5

## 2020-12-07 MED ORDER — FENTANYL CITRATE (PF) 100 MCG/2ML IJ SOLN
INTRAMUSCULAR | Status: AC
  Filled 2020-12-07: qty 2

## 2020-12-07 MED ORDER — ONDANSETRON HCL 4 MG/2ML IJ SOLN
INTRAMUSCULAR | Status: AC
  Filled 2020-12-07: qty 2

## 2020-12-07 MED ORDER — DEXAMETHASONE SODIUM PHOSPHATE 10 MG/ML IJ SOLN
INTRAMUSCULAR | Status: DC | PRN
  Administered 2020-12-07: 8 mg via INTRAVENOUS

## 2020-12-07 MED ORDER — CEFAZOLIN SODIUM-DEXTROSE 2-4 GM/100ML-% IV SOLN
2.0000 g | INTRAVENOUS | Status: AC
  Administered 2020-12-07: 2 g via INTRAVENOUS

## 2020-12-07 MED ORDER — LACTATED RINGERS IV SOLN: INTRAVENOUS | Status: DC

## 2020-12-07 MED ORDER — PHENYLEPHRINE 40 MCG/ML (10ML) SYRINGE FOR IV PUSH (FOR BLOOD PRESSURE SUPPORT)
PREFILLED_SYRINGE | INTRAVENOUS | Status: DC | PRN
  Administered 2020-12-07: 40 ug via INTRAVENOUS

## 2020-12-07 MED ORDER — DEXAMETHASONE SODIUM PHOSPHATE 10 MG/ML IJ SOLN
INTRAMUSCULAR | Status: AC
  Filled 2020-12-07: qty 1

## 2020-12-07 MED ORDER — FENTANYL CITRATE (PF) 250 MCG/5ML IJ SOLN
INTRAMUSCULAR | Status: DC | PRN
  Administered 2020-12-07: 25 ug via INTRAVENOUS
  Administered 2020-12-07: 100 ug via INTRAVENOUS
  Administered 2020-12-07: 25 ug via INTRAVENOUS
  Administered 2020-12-07: 50 ug via INTRAVENOUS

## 2020-12-07 MED ORDER — ROCURONIUM BROMIDE 10 MG/ML (PF) SYRINGE
PREFILLED_SYRINGE | INTRAVENOUS | Status: DC | PRN
  Administered 2020-12-07: 10 mg via INTRAVENOUS
  Administered 2020-12-07: 50 mg via INTRAVENOUS
  Administered 2020-12-07: 5 mg via INTRAVENOUS

## 2020-12-07 MED ORDER — CHLORHEXIDINE GLUCONATE CLOTH 2 % EX PADS: 6.0000 | MEDICATED_PAD | Freq: Once | CUTANEOUS | Status: DC

## 2020-12-07 MED ORDER — MIDAZOLAM HCL 2 MG/2ML IJ SOLN
INTRAMUSCULAR | Status: DC | PRN
  Administered 2020-12-07: 2 mg via INTRAVENOUS

## 2020-12-07 MED ORDER — BUPIVACAINE-EPINEPHRINE (PF) 0.5% -1:200000 IJ SOLN
INTRAMUSCULAR | Status: AC
  Filled 2020-12-07: qty 30

## 2020-12-07 MED ORDER — CEFAZOLIN SODIUM-DEXTROSE 2-4 GM/100ML-% IV SOLN
INTRAVENOUS | Status: AC
  Filled 2020-12-07: qty 100

## 2020-12-07 MED ORDER — LISINOPRIL 20 MG PO TABS
40.0000 mg | ORAL_TABLET | Freq: Every day | ORAL | Status: DC
  Administered 2020-12-07 – 2020-12-08 (×2): 40 mg via ORAL
  Filled 2020-12-07 (×2): qty 2

## 2020-12-07 MED ORDER — PROMETHAZINE HCL 25 MG/ML IJ SOLN: 6.2500 mg | INTRAMUSCULAR | Status: DC | PRN

## 2020-12-07 MED ORDER — OXYCODONE HCL 5 MG/5ML PO SOLN: 5.0000 mg | Freq: Once | ORAL | Status: DC | PRN

## 2020-12-07 MED ORDER — TRAMADOL HCL 50 MG PO TABS
50.0000 mg | ORAL_TABLET | Freq: Four times a day (QID) | ORAL | Status: DC | PRN
  Administered 2020-12-07 – 2020-12-08 (×2): 50 mg via ORAL
  Filled 2020-12-07 (×2): qty 1

## 2020-12-07 MED ORDER — SUGAMMADEX SODIUM 200 MG/2ML IV SOLN
INTRAVENOUS | Status: DC | PRN
  Administered 2020-12-07: 200 mg via INTRAVENOUS

## 2020-12-07 MED ORDER — TRAMADOL HCL 50 MG PO TABS: 50.0000 mg | ORAL_TABLET | Freq: Four times a day (QID) | ORAL | 0 refills | Status: DC | PRN

## 2020-12-07 MED ORDER — ONDANSETRON HCL 4 MG/2ML IJ SOLN: 4.0000 mg | Freq: Four times a day (QID) | INTRAMUSCULAR | Status: DC | PRN

## 2020-12-07 MED ORDER — ORAL CARE MOUTH RINSE
15.0000 mL | Freq: Once | OROMUCOSAL | Status: AC
  Administered 2020-12-07: 15 mL via OROMUCOSAL

## 2020-12-07 MED ORDER — LACTATED RINGERS IR SOLN
Status: DC | PRN
  Administered 2020-12-07: 1000 mL

## 2020-12-07 MED ORDER — ONDANSETRON HCL 4 MG/2ML IJ SOLN
INTRAMUSCULAR | Status: DC | PRN
  Administered 2020-12-07: 4 mg via INTRAVENOUS

## 2020-12-07 MED ORDER — SCOPOLAMINE 1 MG/3DAYS TD PT72
MEDICATED_PATCH | TRANSDERMAL | Status: DC | PRN
  Administered 2020-12-07: 1 via TRANSDERMAL

## 2020-12-07 MED ORDER — BUPIVACAINE-EPINEPHRINE 0.5% -1:200000 IJ SOLN
INTRAMUSCULAR | Status: DC | PRN
  Administered 2020-12-07: 30 mL

## 2020-12-07 MED ORDER — OXYCODONE HCL 5 MG PO TABS
5.0000 mg | ORAL_TABLET | ORAL | Status: DC | PRN
Start: 2020-12-07 — End: 2020-12-08
  Administered 2020-12-07: 10 mg via ORAL
  Filled 2020-12-07: qty 2

## 2020-12-07 MED ORDER — ONDANSETRON 4 MG PO TBDP: 4.0000 mg | ORAL_TABLET | Freq: Four times a day (QID) | ORAL | Status: DC | PRN

## 2020-12-07 MED ORDER — PROPOFOL 10 MG/ML IV BOLUS
INTRAVENOUS | Status: DC | PRN
  Administered 2020-12-07: 170 mg via INTRAVENOUS

## 2020-12-07 MED ORDER — SODIUM CHLORIDE 0.45 % IV SOLN: INTRAVENOUS | Status: DC

## 2020-12-07 MED ORDER — LIDOCAINE 2% (20 MG/ML) 5 ML SYRINGE
INTRAMUSCULAR | Status: DC | PRN
  Administered 2020-12-07: 60 mg via INTRAVENOUS

## 2020-12-07 MED ORDER — ACETAMINOPHEN 650 MG RE SUPP: 650.0000 mg | Freq: Four times a day (QID) | RECTAL | Status: DC | PRN

## 2020-12-07 MED ORDER — 0.9 % SODIUM CHLORIDE (POUR BTL) OPTIME
TOPICAL | Status: DC | PRN
  Administered 2020-12-07: 1000 mL

## 2020-12-07 MED ORDER — HYDROMORPHONE HCL 1 MG/ML IJ SOLN: 1.0000 mg | INTRAMUSCULAR | Status: DC | PRN

## 2020-12-07 MED ORDER — FENTANYL CITRATE (PF) 100 MCG/2ML IJ SOLN
25.0000 ug | INTRAMUSCULAR | Status: DC | PRN
  Administered 2020-12-07 (×2): 50 ug via INTRAVENOUS

## 2020-12-07 MED ORDER — PROPOFOL 10 MG/ML IV BOLUS
INTRAVENOUS | Status: AC
  Filled 2020-12-07: qty 40

## 2020-12-07 MED ORDER — ROCURONIUM BROMIDE 10 MG/ML (PF) SYRINGE
PREFILLED_SYRINGE | INTRAVENOUS | Status: AC
  Filled 2020-12-07: qty 10

## 2020-12-07 MED ORDER — IOHEXOL 300 MG/ML  SOLN
INTRAMUSCULAR | Status: DC | PRN
  Administered 2020-12-07: 5.5 mL

## 2020-12-07 MED ORDER — FENTANYL CITRATE (PF) 100 MCG/2ML IJ SOLN
INTRAMUSCULAR | Status: AC
  Administered 2020-12-07: 50 ug via INTRAVENOUS
  Filled 2020-12-07: qty 2

## 2020-12-07 MED ORDER — CHLORHEXIDINE GLUCONATE 0.12 % MT SOLN: 15.0000 mL | Freq: Once | OROMUCOSAL | Status: AC

## 2020-12-07 MED ORDER — HYDROCHLOROTHIAZIDE 12.5 MG PO CAPS
12.5000 mg | ORAL_CAPSULE | Freq: Every day | ORAL | Status: DC
  Administered 2020-12-07 – 2020-12-08 (×2): 12.5 mg via ORAL
  Filled 2020-12-07 (×2): qty 1

## 2020-12-07 MED ORDER — OXYCODONE HCL 5 MG PO TABS: 5.0000 mg | ORAL_TABLET | Freq: Once | ORAL | Status: DC | PRN

## 2020-12-07 MED ORDER — PHENYLEPHRINE 40 MCG/ML (10ML) SYRINGE FOR IV PUSH (FOR BLOOD PRESSURE SUPPORT)
PREFILLED_SYRINGE | INTRAVENOUS | Status: AC
  Filled 2020-12-07: qty 10

## 2020-12-07 MED ORDER — SCOPOLAMINE 1 MG/3DAYS TD PT72
MEDICATED_PATCH | TRANSDERMAL | Status: AC
  Filled 2020-12-07: qty 1

## 2020-12-07 MED ORDER — MIDAZOLAM HCL 2 MG/2ML IJ SOLN
INTRAMUSCULAR | Status: AC
  Filled 2020-12-07: qty 2

## 2020-12-07 SURGICAL SUPPLY — 34 items
APPLIER CLIP ROT 10 11.4 M/L (STAPLE) ×2
CABLE HIGH FREQUENCY MONO STRZ (ELECTRODE) ×2 IMPLANT
CHLORAPREP W/TINT 26 (MISCELLANEOUS) ×4 IMPLANT
CLIP APPLIE ROT 10 11.4 M/L (STAPLE) ×1 IMPLANT
COVER MAYO STAND STRL (DRAPES) ×2 IMPLANT
COVER SURGICAL LIGHT HANDLE (MISCELLANEOUS) ×2 IMPLANT
COVER WAND RF STERILE (DRAPES) IMPLANT
DECANTER SPIKE VIAL GLASS SM (MISCELLANEOUS) ×2 IMPLANT
DERMABOND ADVANCED (GAUZE/BANDAGES/DRESSINGS) ×1
DERMABOND ADVANCED .7 DNX12 (GAUZE/BANDAGES/DRESSINGS) ×1 IMPLANT
DRAPE C-ARM 42X120 X-RAY (DRAPES) ×2 IMPLANT
ELECT REM PT RETURN 15FT ADLT (MISCELLANEOUS) ×2 IMPLANT
GAUZE SPONGE 2X2 8PLY STRL LF (GAUZE/BANDAGES/DRESSINGS) ×1 IMPLANT
GLOVE SURG ORTHO LTX SZ8 (GLOVE) ×2 IMPLANT
GOWN STRL REUS W/TWL XL LVL3 (GOWN DISPOSABLE) ×4 IMPLANT
HEMOSTAT SURGICEL 4X8 (HEMOSTASIS) IMPLANT
KIT BASIN OR (CUSTOM PROCEDURE TRAY) ×2 IMPLANT
KIT TURNOVER KIT A (KITS) ×2 IMPLANT
PENCIL SMOKE EVACUATOR (MISCELLANEOUS) IMPLANT
POUCH SPECIMEN RETRIEVAL 10MM (ENDOMECHANICALS) ×2 IMPLANT
SCISSORS LAP 5X35 DISP (ENDOMECHANICALS) ×2 IMPLANT
SET CHOLANGIOGRAPH MIX (MISCELLANEOUS) ×2 IMPLANT
SET IRRIG TUBING LAPAROSCOPIC (IRRIGATION / IRRIGATOR) ×2 IMPLANT
SET TUBE SMOKE EVAC HIGH FLOW (TUBING) IMPLANT
SLEEVE XCEL OPT CAN 5 100 (ENDOMECHANICALS) ×2 IMPLANT
SPONGE GAUZE 2X2 STER 10/PKG (GAUZE/BANDAGES/DRESSINGS) ×1
STRIP CLOSURE SKIN 1/2X4 (GAUZE/BANDAGES/DRESSINGS) IMPLANT
SUT MNCRL AB 4-0 PS2 18 (SUTURE) ×2 IMPLANT
TOWEL OR 17X26 10 PK STRL BLUE (TOWEL DISPOSABLE) ×2 IMPLANT
TOWEL OR NON WOVEN STRL DISP B (DISPOSABLE) ×2 IMPLANT
TRAY LAPAROSCOPIC (CUSTOM PROCEDURE TRAY) ×2 IMPLANT
TROCAR BLADELESS OPT 5 100 (ENDOMECHANICALS) ×2 IMPLANT
TROCAR XCEL BLUNT TIP 100MML (ENDOMECHANICALS) ×2 IMPLANT
TROCAR XCEL NON-BLD 11X100MML (ENDOMECHANICALS) ×2 IMPLANT

## 2020-12-07 NOTE — Anesthesia Postprocedure Evaluation (Signed)
Anesthesia Post Note  Patient: Katreena Schupp  Procedure(s) Performed: LAPAROSCOPIC CHOLECYSTECTOMY WITH INTRAOPERATIVE CHOLANGIOGRAM (N/A Abdomen)     Patient location during evaluation: PACU Anesthesia Type: General Level of consciousness: awake and alert Pain management: pain level controlled Vital Signs Assessment: post-procedure vital signs reviewed and stable Respiratory status: spontaneous breathing, nonlabored ventilation, respiratory function stable and patient connected to nasal cannula oxygen Cardiovascular status: blood pressure returned to baseline and stable Postop Assessment: no apparent nausea or vomiting Anesthetic complications: no   No complications documented.  Last Vitals:  Vitals:   12/07/20 0930 12/07/20 0945  BP: 122/70 117/71  Pulse: 88 75  Resp: 12 (!) 7  Temp:    SpO2: 100% 100%    Last Pain:  Vitals:   12/07/20 0930  TempSrc:   PainSc: 5                  Beryle Lathe

## 2020-12-07 NOTE — Discharge Instructions (Signed)
[facility]  LAPAROSCOPIC SURGERY:  POST-OP INSTRUCTIONS  Always review your discharge instruction sheet given to you by the facility where your surgery was performed.  A prescription for pain medication may be given to you upon discharge.  Take your pain medication as prescribed.  If narcotic pain medicine is not needed, then you may take acetaminophen (Tylenol) or ibuprofen (Advil) as needed.  Take your usually prescribed medications unless otherwise directed.  If you need a refill on your pain medication, please contact your pharmacy.  They will contact our office to request authorization. Prescriptions will not be filled after 5 P.M. or on weekends.  You should follow a light diet the first few days after arrival home, such as soup and crackers or toast.  Be sure to include plenty of fluids daily.  Most patients will experience some swelling and bruising in the area of the incisions.  Ice packs will help.  Swelling and bruising can take several days to resolve.   It is common to experience some constipation after surgery.  Increasing fluid intake and taking a stool softener (such as Colace) will usually help or prevent this problem from occurring.  A mild laxative (Milk of Magnesia or Miralax) should be taken according to package instructions if there has been no bowel movement after 48 hours.  You will likely have Dermabond (topical glue) over your incisions.  This seals the incisions and allows you to bathe and shower at any time after your surgery.  Glue should remain in place for up to 10 days.  It may be removed after 10 days by pealing off the Dermabond material or using Vaseline or naval jelly to remove.  If you have steri-strips over your incisions, you may remove the gauze bandage on the second day after surgery, and you may shower at that time.  Leave your steri-strips (small skin tapes) in place directly over the incision.  These strips should remain on the  skin for 5-7 days and then be removed.  You may get them wet in the shower and pat them dry.  Any sutures or staples will be removed at the office during your follow-up visit.  ACTIVITIES:  You may resume regular (light) daily activities beginning the next day - such as daily self-care, walking, climbing stairs - gradually increasing activities as tolerated.  You may have sexual intercourse when it is comfortable.  Refrain from any heavy lifting or straining until approved by your doctor.  You may drive when you are no longer taking prescription pain medication, when you can comfortably wear a seatbelt, and when you can safely maneuver your car and apply brakes.  You should see your doctor in the office for a follow-up appointment approximately 2-3 weeks after your surgery.  Make sure that you call for this appointment within a day or two after you arrive home to insure a convenient appointment time.  WHEN TO CALL YOUR DOCTOR: 1. Fever over 101.0 2. Inability to urinate 3. Continued bleeding from incision 4. Increased pain, redness, or drainage from the incision 5. Increasing abdominal pain  The clinic staff is available to answer your questions during regular business hours.  Please don't hesitate to call and ask to speak to one of the nurses for clinical concerns.  If you have a medical emergency, go to the nearest emergency room or call 911.  A surgeon from [facility] is always on call for the hospital.  Leighanna Kirn M. Jennye Runquist, MD, FACS [facility] Office: [phone] Toll Free:  [phone] FAX [phone]  Website: [email or web address] 

## 2020-12-07 NOTE — Op Note (Signed)
Procedure Note  Pre-operative Diagnosis:  Chronic cholecystitis, cholelithiasis  Post-operative Diagnosis:  same  Surgeon:  Darnell Level, MD  Assistant:  none   Procedure:  Laparoscopic cholecystectomy with intra-operative cholangiography  Anesthesia:  General  Estimated Blood Loss:  minimal  Drains: none         Specimen: gallbladder to pathology  Indications:  Patient is referred by Jarold Motto, PA, for surgical evaluation and management of symptomatic cholelithiasis and chronic cholecystitis. Patient has had symptoms intermittently over the past 70 years. Symptoms have been more prominent over the past year. She has had at least 4 attacks with significant epigastric abdominal pain radiating to the back associated with nausea which lasted up to 4 hours. She denies any history of jaundice or acholic stools. She denies any history of pancreatitis or hepatitis. Her only previous abdominal surgery was a cesarean section. Patient underwent an ultrasound on August 30, 2020. This showed multiple gallstones without biliary dilatation or acute inflammatory signs. There was evidence of fatty infiltration of the liver. Patient now comes to surgery for cholecystectomy.  Procedure Details:  The patient was seen in the pre-op holding area. The risks, benefits, complications, treatment options, and expected outcomes were previously discussed with the patient. The patient agreed with the proposed plan and has signed the informed consent form.  The patient was transported to operating room #1 at the [facility]. The patient was placed in the supine position on the operating room table. Following induction of general anesthesia, the abdomen was prepped and draped in the usual aseptic fashion.  An incision was made in the skin near the umbilicus. The midline fascia was incised and the peritoneal cavity was entered and a Hasson cannula was introduced under direct vision. The cannula was  secured with a 0-Vicryl pursestring suture. Pneumoperitoneum was established with carbon dioxide. Additional cannulae were introduced under direct vision along the right costal margin in the midline, mid-clavicular line, and anterior axillary line.   The gallbladder was identified and the fundus grasped and retracted cephalad. Adhesions were taken down bluntly and the electrocautery was utilized as needed, taking care not to involve any adjacent structures. The infundibulum was grasped and retracted laterally, exposing the peritoneum overlying the triangle of Calot. The peritoneum was incised and structures exposed with blunt dissection. The cystic duct was clearly identified, bluntly dissected circumferentially, and clipped at the neck of the gallbladder.  An incision was made in the cystic duct and the cholangiogram catheter introduced. The catheter was secured using an ligaclip.  Real-time cholangiography was performed using C-arm fluoroscopy.  There was rapid filling of a normal caliber common bile duct.  There was reflux of contrast into the left and right hepatic ductal systems.  There was free flow distally into the duodenum without filling defect or obstruction.  The catheter was removed from the peritoneal cavity.  The cystic duct was then ligated with ligaclips and divided. The cystic artery was identified, dissected circumferentially, ligated with ligaclips, and divided.  The gallbladder was dissected away from the gallbladder bed using the electrocautery for hemostasis. The gallbladder was completely removed from the liver and placed into an endocatch bag. The gallbladder was removed in the endocatch bag through the umbilical port site and submitted to pathology for review.  The right upper quadrant was irrigated and the gallbladder bed was inspected. Hemostasis was achieved with the electrocautery.  Cannulae were removed under direct vision and good hemostasis was noted. Pneumoperitoneum was  released and the majority of  the carbon dioxide evacuated. The umbilical wound was irrigated and the fascia was then closed with the pursestring suture.  Local anesthetic was infiltrated at all port sites. Skin incisions were closed with 4-0 Monocril subcuticular sutures and Dermabond was applied.  Instrument, sponge, and needle counts were correct at the conclusion of the case.  The patient was awakened from anesthesia and brought to the recovery room in stable condition.  The patient tolerated the procedure well.   Darnell Level, MD [facility] Office: [phone]

## 2020-12-07 NOTE — Transfer of Care (Signed)
Immediate Anesthesia Transfer of Care Note  Patient: Christine Webb  Procedure(s) Performed: LAPAROSCOPIC CHOLECYSTECTOMY WITH INTRAOPERATIVE CHOLANGIOGRAM (N/A Abdomen)  Patient Location: PACU  Anesthesia Type:General  Level of Consciousness: awake, alert  and patient cooperative  Airway & Oxygen Therapy: Patient Spontanous Breathing and Patient connected to face mask oxygen  Post-op Assessment: Report given to RN and Post -op Vital signs reviewed and stable  Post vital signs: Reviewed and stable  Last Vitals:  Vitals Value Taken Time  BP    Temp    Pulse 107 12/07/20 0849  Resp 17 12/07/20 0849  SpO2 100 % 12/07/20 0849  Vitals shown include unvalidated device data.  Last Pain:  Vitals:   12/07/20 0555  TempSrc: Oral         Complications: No complications documented.

## 2020-12-07 NOTE — Anesthesia Procedure Notes (Signed)
Procedure Name: Intubation Date/Time: 12/07/2020 7:36 AM Performed by: Eben Burow, CRNA Pre-anesthesia Checklist: Patient identified, Emergency Drugs available, Suction available, Patient being monitored and Timeout performed Patient Re-evaluated:Patient Re-evaluated prior to induction Oxygen Delivery Method: Circle system utilized Preoxygenation: Pre-oxygenation with 100% oxygen Induction Type: IV induction Ventilation: Mask ventilation without difficulty Laryngoscope Size: Mac and 4 Grade View: Grade I Tube type: Oral Number of attempts: 1 Airway Equipment and Method: Stylet Placement Confirmation: ETT inserted through vocal cords under direct vision,  positive ETCO2 and breath sounds checked- equal and bilateral Secured at: 21 cm Tube secured with: Tape Dental Injury: Teeth and Oropharynx as per pre-operative assessment

## 2020-12-07 NOTE — Interval H&P Note (Signed)
History and Physical Interval Note:  12/07/2020 6:58 AM  Christine Webb  has presented today for surgery, with the diagnosis of CHRONIC CHOLECYSTITIS, CHOLELITHIASIS.  The various methods of treatment have been discussed with the patient and family. After consideration of risks, benefits and other options for treatment, the patient has consented to    Procedure(s) with comments: LAPAROSCOPIC CHOLECYSTECTOMY WITH INTRAOPERATIVE CHOLANGIOGRAM (N/A) - ROOM 1 STARTING AT 07:30AM FOR 90 MIN as a surgical intervention.    The patient's history has been reviewed, patient examined, no change in status, stable for surgery.  I have reviewed the patient's chart and labs.  Questions were answered to the patient's satisfaction.    Darnell Level, MD [facility] Office: [phone]   Darnell Level

## 2020-12-08 ENCOUNTER — Encounter (HOSPITAL_COMMUNITY): Payer: Self-pay | Admitting: Surgery

## 2020-12-08 DIAGNOSIS — Z79899 Other long term (current) drug therapy: Secondary | ICD-10-CM | POA: Diagnosis not present

## 2020-12-08 DIAGNOSIS — K8010 Calculus of gallbladder with chronic cholecystitis without obstruction: Secondary | ICD-10-CM | POA: Diagnosis not present

## 2020-12-08 DIAGNOSIS — Z885 Allergy status to narcotic agent status: Secondary | ICD-10-CM | POA: Diagnosis not present

## 2020-12-08 NOTE — Discharge Summary (Signed)
Physician Discharge Summary    Patient ID: Christine Webb MRN: [id number] DOB/AGE: 50-Sep-1972  50 y.o.  Patient Care Team: Jarold Motto, [place] as PCP - General (Physician Assistant)  Admit date: 12/07/2020  Discharge date: 12/08/2020  Hospital Stay = 0 days    Discharge Diagnoses:  Principal Problem:   Cholelithiasis with chronic cholecystitis   1 Day Post-Op  12/07/2020  POST-OPERATIVE DIAGNOSIS:   CHRONIC CHOLECYSTITIS, CHOLELITHIASIS  SURGERY:  12/07/2020  Procedure(s): LAPAROSCOPIC CHOLECYSTECTOMY WITH INTRAOPERATIVE CHOLANGIOGRAM  SURGEON:    Surgeon(s): Darnell Level, MD  Consults: None  Hospital Course:   The patient underwent the surgery above.  Postoperatively, the patient gradually mobilized and advanced to a solid diet.  Pain and other symptoms were treated aggressively.    By the time of discharge, the patient was walking well the hallways, eating food, having flatus.  Pain was well-controlled on an oral medications.  Based on meeting discharge criteria and continuing to recover, I felt it was safe for the patient to be discharged from the hospital to further recover with close followup. Postoperative recommendations were discussed in detail.  They are written as well.  Discharged Condition: good  Discharge Exam: Blood pressure 104/60, pulse (!) 58, temperature 98.7 F (37.1 C), temperature source Oral, resp. rate 15, last menstrual period 11/13/2020, SpO2 98 %.  General: Pt awake/alert/oriented x4 in No acute distress Eyes: PERRL, normal EOM.  Sclera clear.  No icterus Neuro: CN II-XII intact w/o focal sensory/motor deficits. Lymph: No head/neck/groin lymphadenopathy Psych:  No delerium/psychosis/paranoia HENT: Normocephalic, Mucus membranes moist.  No thrush Neck: Supple, No tracheal deviation Chest: No chest wall pain w good excursion CV:  Pulses intact.  Regular rhythm MS: Normal AROM mjr joints.  No obvious deformity Abdomen: Soft.   Nondistended.  Mildly tender at incisions only.  No evidence of peritonitis.  No incarcerated hernias. Ext:  SCDs BLE.  No mjr edema.  No cyanosis Skin: No petechiae / purpura   Disposition:    Follow-up Information    Darnell Level, MD. Schedule an appointment as soon as possible for a visit in 3 weeks.   Specialty: General Surgery Why: For wound re-check Contact information: [address] [place] [address] [phone]               Discharge disposition: 01-Home or Self Care       Discharge Instructions    Call MD for:   Complete by: As directed    FEVER > 101.5 F  (temperatures < 101.5 F are not significant)   Call MD for:  extreme fatigue   Complete by: As directed    Call MD for:  persistant dizziness or light-headedness   Complete by: As directed    Call MD for:  persistant nausea and vomiting   Complete by: As directed    Call MD for:  redness, tenderness, or signs of infection (pain, swelling, redness, odor or green/yellow discharge around incision site)   Complete by: As directed    Call MD for:  severe uncontrolled pain   Complete by: As directed    Diet - low sodium heart healthy   Complete by: As directed    Start with a bland diet such as soups, liquids, starchy foods, low fat foods, etc. the first few days at home. Gradually advance to a solid, low-fat, high fiber diet by the end of the first week at home.   Add a fiber supplement to your diet (Metamucil, etc) If  you feel full, bloated, or constipated, stay on a full liquid or pureed/blenderized diet for a few days until you feel better and are no longer constipated.   Discharge instructions   Complete by: As directed    See Discharge Instructions If you are not getting better after two weeks or are noticing you are getting worse, contact our office [phone] for further advice.  We may need to adjust your medications, re-evaluate you in the office, send you to the emergency  room, or see what other things we can do to help. The clinic staff is available to answer your questions during regular business hours (8:30am-5pm).  Please don't hesitate to call and ask to speak to one of our nurses for clinical concerns.    A surgeon from [facility] is always on call at the hospitals 24 hours/day If you have a medical emergency, go to the nearest emergency room or call 911.   Discharge wound care:   Complete by: As directed    It is good for closed incisions and even open wounds to be washed every day.  Shower every day.  Short baths are fine.  Wash the incisions and wounds clean with soap & water.    You may leave closed incisions open to air if it is dry.   You may cover the incision with clean gauze & replace it after your daily shower for comfort.  DERMABOND:  You have purple skin glue (Dermabond) on your incision(s).  Leave them in place, and they will fall off on their own like a scab in 2-3 weeks.  You may trim any edges that curl up with clean scissors.   Driving Restrictions   Complete by: As directed    You may drive when: - you are no longer taking narcotic prescription pain medication - you can comfortably wear a seatbelt - you can safely make sudden turns/stops without pain.   Increase activity slowly   Complete by: As directed    Start light daily activities --- self-care, walking, climbing stairs- beginning the day after surgery.  Gradually increase activities as tolerated.  Control your pain to be active.  Stop when you are tired.  Ideally, walk several times a day, eventually an hour a day.   Most people are back to most day-to-day activities in a few weeks.  It takes 4-6 weeks to get back to unrestricted, intense activity. If you can walk 30 minutes without difficulty, it is safe to try more intense activity such as jogging, treadmill, bicycling, low-impact aerobics, swimming, etc. Save the most intensive and strenuous activity for last  (Usually 4-8 weeks after surgery) such as sit-ups, heavy lifting, contact sports, etc.  Refrain from any intense heavy lifting or straining until you are off narcotics for pain control.  You will have off days, but things should improve week-by-week. DO NOT PUSH THROUGH PAIN.  Let pain be your guide: If it hurts to do something, don't do it.   Lifting restrictions   Complete by: As directed    If you can walk 30 minutes without difficulty, it is safe to try more intense activity such as jogging, treadmill, bicycling, low-impact aerobics, swimming, etc. Save the most intensive and strenuous activity for last (Usually 4-8 weeks after surgery) such as sit-ups, heavy lifting, contact sports, etc.   Refrain from any intense heavy lifting or straining until you are off narcotics for pain control.  You will have off days, but things should improve  week-by-week. DO NOT PUSH THROUGH PAIN.  Let pain be your guide: If it hurts to do something, don't do it.  Pain is your body warning you to avoid that activity for another week until the pain goes down.   May shower / Bathe   Complete by: As directed    May walk up steps   Complete by: As directed    Remove dressing in 72 hours   Complete by: As directed    Make sure all dressings are removed by the third day after surgery.  Leave incisions open to air.  OK to cover incisions with gauze or bandages as desired   Sexual Activity Restrictions   Complete by: As directed    You may have sexual intercourse when it is comfortable. If it hurts to do something, stop.      Allergies as of 12/08/2020      Reactions   Morphine And Related    Pt preference - makes pt really itchy       Medication List    TAKE these medications   cetirizine 10 MG tablet Commonly known as: ZYRTEC Take 10 mg by mouth daily as needed for allergies.   hydrochlorothiazide 12.5 MG capsule Commonly known as: MICROZIDE Take 1 capsule (12.5 mg total) by mouth daily.   lisinopril 40  MG tablet Commonly known as: ZESTRIL Take 1 tablet (40 mg total) by mouth daily.   meclizine 12.5 MG tablet Commonly known as: ANTIVERT Take 1 tablet (12.5 mg total) by mouth 3 (three) times daily as needed for dizziness.   ondansetron 4 MG tablet Commonly known as: Zofran Take 1 tablet (4 mg total) by mouth every 8 (eight) hours as needed for nausea or vomiting.   scopolamine 1 MG/3DAYS Commonly known as: TRANSDERM-SCOP Place 1 patch (1.5 mg total) onto the skin every 3 (three) days. Start >4 hours prior to event.   traMADol 50 MG tablet Commonly known as: ULTRAM Take 1-2 tablets (50-100 mg total) by mouth every 6 (six) hours as needed.            Discharge Care Instructions  (From admission, onward)         Start     Ordered   12/08/20 0000  Discharge wound care:       Comments: It is good for closed incisions and even open wounds to be washed every day.  Shower every day.  Short baths are fine.  Wash the incisions and wounds clean with soap & water.    You may leave closed incisions open to air if it is dry.   You may cover the incision with clean gauze & replace it after your daily shower for comfort.  DERMABOND:  You have purple skin glue (Dermabond) on your incision(s).  Leave them in place, and they will fall off on their own like a scab in 2-3 weeks.  You may trim any edges that curl up with clean scissors.   12/08/20 0955          Significant Diagnostic Studies:  Results for orders placed or performed during the hospital encounter of 12/07/20 (from the past 72 hour(s))  Pregnancy, urine per protocol     Status: None   Collection Time: 12/07/20  5:42 AM  Result Value Ref Range   Preg Test, Ur NEGATIVE NEGATIVE    Comment:        THE SENSITIVITY OF THIS METHODOLOGY IS >20 mIU/mL. Performed at [facility], [address] [place], [place] [address]     No results found.  Past Medical History:  Diagnosis Date  . Allergy   .  Arthritis   . History of chicken pox   . History of shingles   . Hypertension   . Iron deficiency anemia    Ablation; did require transfusions    Past Surgical History:  Procedure Laterality Date  . CESAREAN SECTION  2002, 2004  . CHOLECYSTECTOMY N/A 12/07/2020   Procedure: LAPAROSCOPIC CHOLECYSTECTOMY WITH INTRAOPERATIVE CHOLANGIOGRAM;  Surgeon: Darnell Level, MD;  Location: [facility];  Service: General;  Laterality: N/A;  ROOM 1 STARTING AT 07:30AM FOR 90 MIN  . KNEE SURGERY  2012   3 surgeries    Social History   Socioeconomic History  . Marital status: Married    Spouse name: Not on file  . Number of children: Not on file  . Years of education: Not on file  . Highest education level: Not on file  Occupational History  . Not on file  Tobacco Use  . Smoking status: Never Smoker  . Smokeless tobacco: Never Used  Vaping Use  . Vaping Use: Never used  Substance and Sexual Activity  . Alcohol use: Yes    Alcohol/week: 3.0 standard drinks    Types: 3 Glasses of wine per week    Comment: occa.  . Drug use: Never  . Sexual activity: Yes    Birth control/protection: Surgical    Comment: Husband Vasectomy  Other Topics Concern  . Not on file  Social History Narrative   Married   Work FT -- Teacher, English as a foreign language   2 children (1 college, 1 high school)   Likes to travel   Social Determinants of Corporate investment banker Strain: Not on [employer] Insecurity: Not on file  Transportation Needs: Not on file  Physical Activity: Not on file  Stress: Not on file  Social Connections: Not on file  Intimate Partner Violence: Not on file    Family History  Problem Relation Age of Onset  . Hypertension Mother   . Breast cancer Mother   . Colon cancer Mother   . Osteoarthritis Father   . Hypertension Father   . Skin cancer Brother   . Lung cancer Maternal Grandmother   . Kidney cancer Paternal Grandmother     Current Facility-Administered Medications  Medication Dose Route  Frequency Provider Last Rate Last Admin  . 0.45 % sodium chloride infusion   Intravenous Continuous Darnell Level, MD 50 mL/hr at 12/07/20 1200 New Bag at 12/07/20 1200  . acetaminophen (TYLENOL) tablet 650 mg  650 mg Oral Q6H PRN Darnell Level, MD       Or  . acetaminophen (TYLENOL) suppository 650 mg  650 mg Rectal Q6H PRN Darnell Level, MD      . hydrochlorothiazide (MICROZIDE) capsule 12.5 mg  12.5 mg Oral Daily Darnell Level, MD   12.5 mg at 12/07/20 1359  . HYDROmorphone (DILAUDID) injection 1 mg  1 mg Intravenous Q2H PRN Darnell Level, MD      . lisinopril (ZESTRIL) tablet 40 mg  40 mg Oral Daily Darnell Level, MD   40 mg at 12/07/20 1359  . ondansetron (ZOFRAN-ODT) disintegrating tablet 4 mg  4 mg Oral Q6H PRN Darnell Level, MD       Or  . ondansetron (ZOFRAN) injection 4 mg  4 mg Intravenous Q6H PRN Darnell Level, MD      . oxyCODONE (Oxy IR/ROXICODONE) immediate release tablet 5-10 mg  5-10 mg Oral Q4H PRN Darnell Level, MD   10 mg at 12/07/20 2209  . traMADol (ULTRAM) tablet 50 mg  50 mg Oral Q6H PRN Darnell Level, MD   50 mg at 12/07/20 1359     Allergies  Allergen Reactions  . Morphine And Related     Pt preference - makes pt really itchy     Signed: Lorenso Courier, MD, FACS, MASCRS  Gastrointestinal and Minimally Invasive Surgery  [facility] [address] [place], [place] [address] [phone] Fax [phone] Main/Paging  CONTACT INFORMATION: Weekday (9AM-5PM) concerns: Call [facility] main office at [phone] Weeknight (5PM-9AM) or Weekend/Holiday concerns: Check [email or web address] for General Surgery [facility] coverage (Please, do not use SecureChat as it is not reliable communication to operating surgeons for immediate patient care)      12/08/2020, 9:55 AM

## 2020-12-10 LAB — SURGICAL PATHOLOGY

## 2021-04-05 DIAGNOSIS — Z01419 Encounter for gynecological examination (general) (routine) without abnormal findings: Secondary | ICD-10-CM | POA: Diagnosis not present

## 2021-04-05 LAB — HM PAP SMEAR

## 2021-04-05 LAB — RESULTS CONSOLE HPV: CHL HPV: NEGATIVE

## 2021-04-12 DIAGNOSIS — Z1231 Encounter for screening mammogram for malignant neoplasm of breast: Secondary | ICD-10-CM | POA: Diagnosis not present

## 2021-04-12 LAB — HM MAMMOGRAPHY

## 2021-07-04 DIAGNOSIS — D2272 Melanocytic nevi of left lower limb, including hip: Secondary | ICD-10-CM | POA: Diagnosis not present

## 2021-07-04 DIAGNOSIS — L814 Other melanin hyperpigmentation: Secondary | ICD-10-CM | POA: Diagnosis not present

## 2021-07-04 DIAGNOSIS — L570 Actinic keratosis: Secondary | ICD-10-CM | POA: Diagnosis not present

## 2021-07-04 DIAGNOSIS — D225 Melanocytic nevi of trunk: Secondary | ICD-10-CM | POA: Diagnosis not present

## 2021-07-04 DIAGNOSIS — L578 Other skin changes due to chronic exposure to nonionizing radiation: Secondary | ICD-10-CM | POA: Diagnosis not present

## 2021-08-11 ENCOUNTER — Other Ambulatory Visit: Payer: Self-pay | Admitting: Physician Assistant

## 2021-09-11 ENCOUNTER — Other Ambulatory Visit (HOSPITAL_BASED_OUTPATIENT_CLINIC_OR_DEPARTMENT_OTHER): Payer: Self-pay

## 2021-09-11 MED ORDER — FLUARIX QUADRIVALENT 0.5 ML IM SUSY
PREFILLED_SYRINGE | INTRAMUSCULAR | 0 refills | Status: DC
  Filled 2021-09-11: qty 0.5, 1d supply, fill #0

## 2021-09-13 ENCOUNTER — Other Ambulatory Visit (HOSPITAL_BASED_OUTPATIENT_CLINIC_OR_DEPARTMENT_OTHER): Payer: Self-pay

## 2021-11-01 ENCOUNTER — Other Ambulatory Visit: Payer: Self-pay

## 2021-11-01 ENCOUNTER — Ambulatory Visit (INDEPENDENT_AMBULATORY_CARE_PROVIDER_SITE_OTHER): Payer: BC Managed Care – PPO | Admitting: Physician Assistant

## 2021-11-01 ENCOUNTER — Encounter: Payer: Self-pay | Admitting: Physician Assistant

## 2021-11-01 VITALS — BP 118/80 | HR 69 | Temp 97.9°F | Ht 63.5 in | Wt 198.5 lb

## 2021-11-01 DIAGNOSIS — R42 Dizziness and giddiness: Secondary | ICD-10-CM | POA: Insufficient documentation

## 2021-11-01 DIAGNOSIS — Z0000 Encounter for general adult medical examination without abnormal findings: Secondary | ICD-10-CM | POA: Diagnosis not present

## 2021-11-01 DIAGNOSIS — E785 Hyperlipidemia, unspecified: Secondary | ICD-10-CM | POA: Diagnosis not present

## 2021-11-01 DIAGNOSIS — I10 Essential (primary) hypertension: Secondary | ICD-10-CM

## 2021-11-01 DIAGNOSIS — Z114 Encounter for screening for human immunodeficiency virus [HIV]: Secondary | ICD-10-CM

## 2021-11-01 DIAGNOSIS — E669 Obesity, unspecified: Secondary | ICD-10-CM

## 2021-11-01 DIAGNOSIS — Z1159 Encounter for screening for other viral diseases: Secondary | ICD-10-CM | POA: Diagnosis not present

## 2021-11-01 LAB — COMPREHENSIVE METABOLIC PANEL
ALT: 14 U/L (ref 0–35)
AST: 17 U/L (ref 0–37)
Albumin: 4.6 g/dL (ref 3.5–5.2)
Alkaline Phosphatase: 49 U/L (ref 39–117)
BUN: 14 mg/dL (ref 6–23)
CO2: 26 mEq/L (ref 19–32)
Calcium: 9.5 mg/dL (ref 8.4–10.5)
Chloride: 102 mEq/L (ref 96–112)
Creatinine, Ser: 0.84 mg/dL (ref 0.40–1.20)
GFR: 80.7 mL/min (ref >60.00)
Glucose, Bld: 84 mg/dL (ref 70–99)
Potassium: 4.1 mEq/L (ref 3.5–5.1)
Sodium: 135 mEq/L (ref 135–145)
Total Bilirubin: 1.4 mg/dL — ABNORMAL HIGH (ref 0.2–1.2)
Total Protein: 7 g/dL (ref 6.0–8.3)

## 2021-11-01 LAB — CBC WITH DIFFERENTIAL/PLATELET
Basophils Absolute: 0 10*3/uL (ref 0.0–0.1)
Basophils Relative: 0.7 % (ref 0.0–3.0)
Eosinophils Absolute: 0.1 10*3/uL (ref 0.0–0.7)
Eosinophils Relative: 1.9 % (ref 0.0–5.0)
HCT: 42.7 % (ref 36.0–46.0)
Hemoglobin: 14.3 g/dL (ref 12.0–15.0)
Lymphocytes Relative: 31.1 % (ref 12.0–46.0)
Lymphs Abs: 2 10*3/uL (ref 0.7–4.0)
MCHC: 33.4 g/dL (ref 30.0–36.0)
MCV: 92.5 fl (ref 78.0–100.0)
Monocytes Absolute: 0.4 10*3/uL (ref 0.1–1.0)
Monocytes Relative: 5.7 % (ref 3.0–12.0)
Neutro Abs: 3.9 10*3/uL (ref 1.4–7.7)
Neutrophils Relative %: 60.6 % (ref 43.0–77.0)
Platelets: 282 10*3/uL (ref 150.0–400.0)
RBC: 4.62 Mil/uL (ref 3.87–5.11)
RDW: 12.9 % (ref 11.5–15.5)
WBC: 6.5 10*3/uL (ref 4.0–10.5)

## 2021-11-01 LAB — LIPID PANEL
Cholesterol: 220 mg/dL — ABNORMAL HIGH (ref 0–200)
HDL: 64.8 mg/dL (ref >39.00)
LDL Cholesterol: 136 mg/dL — ABNORMAL HIGH (ref 0–99)
NonHDL: 154.89
Total CHOL/HDL Ratio: 3
Triglycerides: 95 mg/dL (ref 0.0–149.0)
VLDL: 19 mg/dL (ref 0.0–40.0)

## 2021-11-01 LAB — HIV ANTIBODY (ROUTINE TESTING W REFLEX): HIV 1&2 Ab, 4th Generation: NONREACTIVE

## 2021-11-01 LAB — HEPATITIS C ANTIBODY
Hepatitis C Ab: NONREACTIVE
SIGNAL TO CUT-OFF: 0.08 (ref <1.00)

## 2021-11-01 NOTE — Patient Instructions (Addendum)
It was great to see you!  You will be contacted about scheduling your calcium score.  Please go to the lab for blood work.   Our office will call you with your results unless you have chosen to receive results via MyChart.  If your blood work is normal we will follow-up each year for physicals and as scheduled for chronic medical problems.  If anything is abnormal we will treat accordingly and get you in for a follow-up.  Take care,  Lelon Mast

## 2021-11-01 NOTE — Progress Notes (Signed)
Subjective:    Christine Webb is a 51 y.o. female and is here for a comprehensive physical exam.   HPI  Health Maintenance Due  Topic Date Due   HIV Screening  Never done   Hepatitis C Screening  Never done   COLONOSCOPY (Pts 45-50yrs Insurance coverage will need to be confirmed)  Never done   COVID-19 Vaccine (3 - Booster for Pfizer series) 02/28/2020    Acute Concerns: None  Chronic Issues: HTN Currently compliant with taking lisinopril 40 mg and microzide 12.5 mg daily with no adverse effects. At home blood pressure readings are: not checked. Patient denies chest pain, SOB, blurred vision, dizziness, unusual headaches, lower leg swelling.  Denies excessive caffeine intake, stimulant usage, excessive alcohol intake, or increase in salt consumption.  BP Readings from Last 3 Encounters:  11/01/21 118/80  12/08/20 107/64  11/30/20 115/80   HLD Per abstracted lab results, patient has a history of elevated LDL. 3 years ago this was 147. She is currently not on any medication. She does have family hx of heart disease.   Health Maintenance: Immunizations -- Covid- UTD Influenza- Due Tdap- UTD;2021 Colonoscopy -- UTD 2019 per patient report Mammogram -- UTD per patient report PAP -- UTD per patient report Bone Density -- N/A Caffeine intake- One mug of coffee a day, has cut down from 2 a day  Diet -- Intakes water regularly  Dentistry- UTD Ophthalmology- UTD Sleep habits -- No concerns Exercise -- 3-4 days x week, cardio and strength training Goal weight:160-170 lbs Weight -- Stable Mood -- Stable Weight history: Wt Readings from Last 10 Encounters:  11/01/21 198 lb 8 oz (90 kg)  11/30/20 215 lb (97.5 kg)  09/11/20 211 lb (95.7 kg)  08/07/20 211 lb (95.7 kg)   Body mass index is 34.61 kg/m. Patient's last menstrual period was 10/11/2021 (approximate). Alcohol use:  reports current alcohol use of about 3.0 standard drinks per week. Tobacco use:  Tobacco Use:  Low Risk    Smoking Tobacco Use: Never   Smokeless Tobacco Use: Never   Passive Exposure: Not on file     Depression screen [facility] 2/9 11/01/2021  Decreased Interest 0  Down, Depressed, Hopeless 0  PHQ - 2 Score 0     Other providers/specialists: Patient Care Team: Jarold Motto, [place] as PCP - General (Physician Assistant)    PMHx, SurgHx, SocialHx, Medications, and Allergies were reviewed in the Visit Navigator and updated as appropriate.   Past Medical History:  Diagnosis Date   Allergy    Arthritis    History of chicken pox    History of shingles    Hypertension    Iron deficiency anemia    Ablation; did require transfusions     Past Surgical History:  Procedure Laterality Date   CESAREAN SECTION  2002, 2004   CHOLECYSTECTOMY N/A 12/07/2020   Procedure: LAPAROSCOPIC CHOLECYSTECTOMY WITH INTRAOPERATIVE CHOLANGIOGRAM;  Surgeon: Darnell Level, MD;  Location: [facility];  Service: General;  Laterality: N/A;  ROOM 1 STARTING AT 07:30AM FOR 90 MIN   KNEE SURGERY  2012   3 surgeries     Family History  Problem Relation Age of Onset   Hypertension Mother    Breast cancer Mother    Colon cancer Mother    Osteoarthritis Father    Hypertension Father    Skin cancer Brother    Lung cancer Maternal Grandmother    Kidney cancer Paternal Grandmother     Social History   Tobacco  Use   Smoking status: Never   Smokeless tobacco: Never  Vaping Use   Vaping Use: Never used  Substance Use Topics   Alcohol use: Yes    Alcohol/week: 3.0 standard drinks    Types: 3 Glasses of wine per week    Comment: weekends only   Drug use: Never    Review of Systems:   Review of Systems  Constitutional:  Negative for chills, fever, malaise/fatigue and weight loss.  HENT:  Negative for hearing loss, sinus pain and sore throat.   Respiratory:  Negative for cough and hemoptysis.   Cardiovascular:  Negative for chest pain, palpitations, leg swelling and PND.  Gastrointestinal:  Negative  for abdominal pain, constipation, diarrhea, heartburn, nausea and vomiting.  Genitourinary:  Negative for dysuria, frequency and urgency.  Musculoskeletal:  Negative for back pain, myalgias and neck pain.  Skin:  Negative for itching and rash.  Neurological:  Negative for dizziness, tingling, seizures and headaches.  Endo/Heme/Allergies:  Negative for polydipsia.  Psychiatric/Behavioral:  Negative for depression. The patient is not nervous/anxious.    Objective:   BP 118/80 (BP Location: Left Arm, Patient Position: Sitting, Cuff Size: Large)    Pulse 69    Temp 97.9 F (36.6 C) (Temporal)    Ht 5' 3.5" (1.613 m)    Wt 198 lb 8 oz (90 kg)    LMP 10/11/2021 (Approximate)    SpO2 97%    BMI 34.61 kg/m  Body mass index is 34.61 kg/m.   General Appearance:    Alert, cooperative, no distress, appears stated age  Head:    Normocephalic, without obvious abnormality, atraumatic  Eyes:    PERRL, conjunctiva/corneas clear, EOM's intact, fundi    benign, both eyes  Ears:    Normal TM's and external ear canals, both ears  Nose:   Nares normal, septum midline, mucosa normal, no drainage    or sinus tenderness  Throat:   Lips, mucosa, and tongue normal; teeth and gums normal  Neck:   Supple, symmetrical, trachea midline, no adenopathy;    thyroid:  no enlargement/tenderness/nodules; no carotid   bruit or JVD  Back:     Symmetric, no curvature, ROM normal, no CVA tenderness  Lungs:     Clear to auscultation bilaterally, respirations unlabored  Chest Wall:    No tenderness or deformity   Heart:    Regular rate and rhythm, S1 and S2 normal, no murmur, rub or gallop  Breast Exam:    Deferred  Abdomen:     Soft, non-tender, bowel sounds active all four quadrants,    no masses, no organomegaly  Genitalia:    Deferred  Extremities:   Extremities normal, atraumatic, no cyanosis or edema  Pulses:   2+ and symmetric all extremities  Skin:   Skin color, texture, turgor normal, no rashes or lesions  Lymph  nodes:   Cervical, supraclavicular, and axillary nodes normal  Neurologic:   CNII-XII intact, normal strength, sensation and reflexes    throughout    Assessment/Plan:   Routine Physical Examination Today patient counseled on age appropriate routine health concerns for screening and prevention, each reviewed and up to date or declined. Immunizations reviewed and up to date or declined. Labs ordered and reviewed. Risk factors for depression reviewed and negative. Hearing function and visual acuity are intact. ADLs screened and addressed as needed. Functional ability and level of safety reviewed and appropriate. Education, counseling and referrals performed based on assessed risks today. Patient provided with a copy  of personalized plan for preventive services.  Primary hypertension Controlled  Continue lisinopril 40 mg daily and Microzide 12.5 mg daily  Monitor BP at home specifically if experiencing symptoms  I advised patient that if BP readings are consistently >150/90, to reach out to office and medication will be adjusted   HLD Update lipid panel today and provide recommendations accordingly I have reviewed CT Calcium Score -- patient verbalizes understanding and is in agreement.  Obesity, unspecified classification, unspecified obesity type, unspecified whether serious comorbidity present Encouraged continuation of healthy eating and participation of daily exercise   Encounter for screening for other viral diseases  - Hepatitis C Antibody   Screening for HIV (human immunodeficiency virus)  - HIV Antibody (routine testing w rflx)   Patient Counseling: [x]    Nutrition: Stressed importance of moderation in sodium/caffeine intake, saturated fat and cholesterol, caloric balance, sufficient intake of fresh fruits, vegetables, fiber, calcium, iron, and 1 mg of folate supplement per day (for females capable of pregnancy).  [x]    Stressed the importance of regular exercise.   [x]     Substance Abuse: Discussed cessation/primary prevention of tobacco, alcohol, or other drug use; driving or other dangerous activities under the influence; availability of treatment for abuse.   [x]    Injury prevention: Discussed safety belts, safety helmets, smoke detector, smoking near bedding or upholstery.   [x]    Sexuality: Discussed sexually transmitted diseases, partner selection, use of condoms, avoidance of unintended pregnancy  and contraceptive alternatives.  [x]    Dental health: Discussed importance of regular tooth brushing, flossing, and dental visits.  [x]    Health maintenance and immunizations reviewed. Please refer to Health maintenance section.   I,Havlyn C Ratchford,acting as a for , PA.,have documented all relevant documentation on the behalf of , PA,as directed by  , PA while in the presence of , .  I, , Neurosurgeon, have reviewed all documentation for this visit. The documentation on 11/01/21 for the exam, diagnosis, procedures, and orders are all accurate and complete.   Jarold Motto, PA-C [place] [facility]

## 2021-11-04 ENCOUNTER — Other Ambulatory Visit: Payer: Self-pay | Admitting: Physician Assistant

## 2021-11-04 DIAGNOSIS — R17 Unspecified jaundice: Secondary | ICD-10-CM

## 2021-11-07 ENCOUNTER — Telehealth: Payer: Self-pay | Admitting: Physician Assistant

## 2021-11-07 NOTE — Telephone Encounter (Signed)
Left message on voicemail to call office.  

## 2021-11-07 NOTE — Telephone Encounter (Signed)
Pt is returning a call to Lupita Leash.

## 2021-11-08 NOTE — Telephone Encounter (Signed)
Left message on voicemail to call office.  

## 2021-11-08 NOTE — Telephone Encounter (Signed)
See result notes. 

## 2021-11-18 ENCOUNTER — Encounter: Payer: Self-pay | Admitting: Physician Assistant

## 2021-11-18 ENCOUNTER — Other Ambulatory Visit: Payer: Self-pay | Admitting: Physician Assistant

## 2021-11-21 ENCOUNTER — Other Ambulatory Visit: Payer: Self-pay

## 2021-11-21 ENCOUNTER — Other Ambulatory Visit (INDEPENDENT_AMBULATORY_CARE_PROVIDER_SITE_OTHER): Payer: BC Managed Care – PPO

## 2021-11-21 DIAGNOSIS — R17 Unspecified jaundice: Secondary | ICD-10-CM | POA: Diagnosis not present

## 2021-11-21 LAB — HEPATIC FUNCTION PANEL
ALT: 16 U/L (ref 0–35)
AST: 17 U/L (ref 0–37)
Albumin: 4.6 g/dL (ref 3.5–5.2)
Alkaline Phosphatase: 45 U/L (ref 39–117)
Bilirubin, Direct: 0.1 mg/dL (ref 0.0–0.3)
Total Bilirubin: 0.9 mg/dL (ref 0.2–1.2)
Total Protein: 6.7 g/dL (ref 6.0–8.3)

## 2021-11-25 ENCOUNTER — Ambulatory Visit (INDEPENDENT_AMBULATORY_CARE_PROVIDER_SITE_OTHER)
Admission: RE | Admit: 2021-11-25 | Discharge: 2021-11-25 | Disposition: A | Discharge status: Home / Self Care | Payer: Self-pay | Source: Ambulatory Visit | Attending: Physician Assistant | Admitting: Physician Assistant

## 2021-11-25 ENCOUNTER — Other Ambulatory Visit: Payer: Self-pay

## 2021-11-25 DIAGNOSIS — E785 Hyperlipidemia, unspecified: Secondary | ICD-10-CM

## 2021-11-26 ENCOUNTER — Other Ambulatory Visit: Payer: Self-pay | Admitting: *Deleted

## 2021-11-26 DIAGNOSIS — R931 Abnormal findings on diagnostic imaging of heart and coronary circulation: Secondary | ICD-10-CM

## 2021-11-26 MED ORDER — ATORVASTATIN CALCIUM 20 MG PO TABS: 20.0000 mg | ORAL_TABLET | Freq: Every day | ORAL | 1 refills | Status: DC

## 2021-12-15 NOTE — Progress Notes (Deleted)
?Cardiology Office Note:   ? ?Date:  12/15/2021  ? ?ID:  Christine Webb, DOB 02-Aug-1971, MRN [id number] ? ?PCP:  Inda Coke, PA ?  ?[facility] Providers ?Cardiologist:  None { ? ? ?Referring MD: Inda Coke, PA  ? ? ?History of Present Illness:   ? ?Christine Webb is a 51 y.o. female with a hx of HTN who was referred by Inda Coke, PA for further evaluation of elevated Ca score.  ? ?Patient underwent Ca score 10/2021 which revealed score 21 which is the 92% for age, gender matched controls.  ? ?Today, *** ? ?Past Medical History:  ?Diagnosis Date  ? Allergy   ? Arthritis   ? History of chicken pox   ? History of shingles   ? Hypertension   ? Iron deficiency anemia   ? Ablation; did require transfusions  ? ? ?Past Surgical History:  ?Procedure Laterality Date  ? CESAREAN SECTION  2002, 2004  ? CHOLECYSTECTOMY N/A 12/07/2020  ? Procedure: LAPAROSCOPIC CHOLECYSTECTOMY WITH INTRAOPERATIVE CHOLANGIOGRAM;  Surgeon: Armandina Gemma, MD;  Location: [facility];  Service: General;  Laterality: N/A;  ROOM 1 STARTING AT 07:30AM FOR 90 MIN  ? KNEE SURGERY  2012  ? 3 surgeries  ? ? ?Current Medications: ?No outpatient medications have been marked as taking for the 12/17/21 encounter (Appointment) with Freada Bergeron, MD.  ?  ? ?Allergies:   Morphine and related  ? ?Social History  ? ?Socioeconomic History  ? Marital status: Married  ?  Spouse name: Not on file  ? Number of children: Not on file  ? Years of education: Not on file  ? Highest education level: Not on file  ?Occupational History  ? Not on file  ?Tobacco Use  ? Smoking status: Never  ? Smokeless tobacco: Never  ?Vaping Use  ? Vaping Use: Never used  ?Substance and Sexual Activity  ? Alcohol use: Yes  ?  Alcohol/week: 3.0 standard drinks  ?  Types: 3 Glasses of wine per week  ?  Comment: weekends only  ? Drug use: Never  ? Sexual activity: Yes  ?  Birth control/protection: Surgical  ?  Comment: Husband Vasectomy  ?Other Topics Concern  ? Not on file   ?Social History Narrative  ? Married  ? Work FT -- Chief Financial Officer  ? 2 children (1 college, 1 high school)  ? Likes to travel  ? ?Social Determinants of Health  ? ?Financial Resource Strain: Not on file  ?Food Insecurity: Not on file  ?Transportation Needs: Not on file  ?Physical Activity: Not on file  ?Stress: Not on file  ?Social Connections: Not on file  ?  ? ?Family History: ?The patient's ***family history includes Breast cancer in her mother; Colon cancer in her mother; Congestive Heart Failure in her mother; Hypertension in her father and mother; Kidney cancer in her paternal grandmother; Lung cancer in her maternal grandmother; Osteoarthritis in her father; Skin cancer in her brother. ? ?ROS:   ?Please see the history of present illness.    ?*** All other systems reviewed and are negative. ? ?EKGs/Labs/Other Studies Reviewed:   ? ?The following studies were reviewed today: ?Ca Score 11/25/21: ?FINDINGS: ?Non-cardiac: See separate report from [facility]. ?  ?Ascending Aorta: Normal caliber. ?  ?Pericardium: Normal. ?  ?Coronary arteries: Normal origins. ?  ?Coronary Calcium Score: ?  ?Left main: 0 ?  ?Left anterior descending artery: 21 ?  ?Left circumflex artery: 0 ?  ?Right coronary artery: 0 ?  ?Ramus  intermedius artery: 0 ?  ?Total: 21 ?  ?Percentile: 92nd for age, sex, and race matched control. ?  ?IMPRESSION: ?1. Coronary calcium score of 21. This was 92nd percentile for age, ?gender, and race matched controls. ?  ? ?EKG:  EKG is *** ordered today.  The ekg ordered today demonstrates *** ? ?Recent Labs: ?11/01/2021: BUN 14; Creatinine, Ser 0.84; Hemoglobin 14.3; Platelets 282.0; Potassium 4.1; Sodium 135 ?11/21/2021: ALT 16  ?Recent Lipid Panel ?   ?Component Value Date/Time  ? CHOL 220 (H) 11/01/2021 1132  ? TRIG 95.0 11/01/2021 1132  ? HDL 64.80 11/01/2021 1132  ? CHOLHDL 3 11/01/2021 1132  ? VLDL 19.0 11/01/2021 1132  ? LDLCALC 136 (H) 11/01/2021 1132  ? ? ? ?Risk Assessment/Calculations:    ?{Does this patient have ATRIAL FIBRILLATION?:[phone]} ? ?    ? ?Physical Exam:   ? ?VS:  There were no vitals taken for this visit.   ? ?Wt Readings from Last 3 Encounters:  ?11/01/21 198 lb 8 oz (90 kg)  ?11/30/20 215 lb (97.5 kg)  ?09/11/20 211 lb (95.7 kg)  ?  ? ?GEN: *** Well nourished, well developed in no acute distress ?HEENT: Normal ?NECK: No JVD; No carotid bruits ?LYMPHATICS: No lymphadenopathy ?CARDIAC: ***RRR, no murmurs, rubs, gallops ?RESPIRATORY:  Clear to auscultation without rales, wheezing or rhonchi  ?ABDOMEN: Soft, non-tender, non-distended ?MUSCULOSKELETAL:  No edema; No deformity  ?SKIN: Warm and dry ?NEUROLOGIC:  Alert and oriented x 3 ?PSYCHIATRIC:  Normal affect  ? ?ASSESSMENT:   ? ?No diagnosis found. ?PLAN:   ? ?In order of problems listed above: ? ?#Coronary Artery Disease: ?Ca score 21 which is the 92% for age, gender, and race matched controls. Currently doing well with no anginal symptoms.  ?-Continue lipitor 20mg  daily ?-Lifestyle modifications as below ? ?#HTN: ?-Continue HCTZ 12.5mg  daily ?-Continue lisinopril 40mg  daily ? ?Exercise recommendations: ?Goal of exercising for at least 30 minutes a day, at least 5 times per week.  Please exercise to a moderate exertion.  This means that while exercising it is difficult to speak in full sentences, however you are not so short of breath that you feel you must stop, and not so comfortable that you can carry on a full conversation.  Exertion level should be approximately a 5/10, if 10 is the most exertion you can perform. ? ?Diet recommendations: ?Recommend a heart healthy diet such as the Mediterranean diet.  This diet consists of plant based foods, healthy fats, lean meats, olive oil.  It suggests limiting the intake of simple carbohydrates such as white breads, pastries, and pastas.  It also limits the amount of red meat, wine, and dairy products such as cheese that one should consume on a daily basis.  ? ?   ? ?{Are you ordering a  CV Procedure (e.g. stress test, cath, DCCV, TEE, etc)?   Press F2        :[id number]  ? ? ?Medication Adjustments/Labs and Tests Ordered: ?Current medicines are reviewed at length with the patient today.  Concerns regarding medicines are outlined above.  ?No orders of the defined types were placed in this encounter. ? ?No orders of the defined types were placed in this encounter. ? ? ?There are no Patient Instructions on file for this visit.  ? ?Signed, ?Freada Bergeron, MD  ?12/15/2021 4:23 PM    ?[place] ?

## 2021-12-16 NOTE — Progress Notes (Signed)
?Cardiology Office Note:   ? ?Date:  12/17/2021  ? ?ID:  Christine DurieKathryn Sockwell, DOB 10/18/1970, MRN [id number] ? ?PCP:  Jarold MottoWorley, Samantha, PA ?  ?[facility] Providers ?Cardiologist:  None { ? ? ?Referring MD: Jarold MottoWorley, Samantha, PA  ? ? ?History of Present Illness:   ? ?Christine Webb is a 51 y.o. female with a hx of HTN who was referred by Jarold MottoSamantha Worley, PA for further evaluation of elevated Ca score.  ? ?Patient underwent Ca score 10/2021 which revealed score 21 which is the 92% for age, gender matched controls.  ? ?Today, she is doing well. She reports her cholesterol has been high her whole life. She was diagnosed with high blood pressure 10 years ago and has been taking Lisinopril since. Her mother had congestive heart failure of unclear cause (?HTN mediated) and was hospitalized in her 6250s. Her mother died 20 years later but not due to cardiovascular issues. ? ?Otherwise, she is doing very well and is active without exertional symptoms. She currently exercises 3-4 x/week. She is working on weight loss and had successfully lost 30lbs in the past. She denies any chest pain, SOB, lightheadedness, dizziness or syncope. Has recently started lipitor and is tolerating it without issues. BP is well controlled.  ? ?She denies chest pain, chest pressure, dyspnea at rest or with exertion, palpitations, PND, orthopnea, or leg swelling. Denies cough, fever, chills. Denies nausea, vomiting. Denies syncope or presyncope. Denies dizziness or lightheadedness. Denies snoring. ? ?Past Medical History:  ?Diagnosis Date  ? Allergy   ? Arthritis   ? History of chicken pox   ? History of shingles   ? Hypertension   ? Iron deficiency anemia   ? Ablation; did require transfusions  ? ? ?Past Surgical History:  ?Procedure Laterality Date  ? CESAREAN SECTION  2002, 2004  ? CHOLECYSTECTOMY N/A 12/07/2020  ? Procedure: LAPAROSCOPIC CHOLECYSTECTOMY WITH INTRAOPERATIVE CHOLANGIOGRAM;  Surgeon: Darnell LevelGerkin, Todd, MD;  Location: [facility];  Service: General;   Laterality: N/A;  ROOM 1 STARTING AT 07:30AM FOR 90 MIN  ? KNEE SURGERY  2012  ? 3 surgeries  ? ? ?Current Medications: ?Current Meds  ?Medication Sig  ? atorvastatin (LIPITOR) 20 MG tablet Take 1 tablet (20 mg total) by mouth daily.  ? cetirizine (ZYRTEC) 10 MG tablet Take 10 mg by mouth daily as needed for allergies.  ? hydrochlorothiazide (MICROZIDE) 12.5 MG capsule TAKE 1 CAPSULE BY MOUTH EVERY DAY  ? lisinopril (ZESTRIL) 40 MG tablet TAKE 1 TABLET BY MOUTH EVERY DAY  ? meclizine (ANTIVERT) 12.5 MG tablet Take 1 tablet (12.5 mg total) by mouth 3 (three) times daily as needed for dizziness.  ? ondansetron (ZOFRAN) 4 MG tablet Take 1 tablet (4 mg total) by mouth every 8 (eight) hours as needed for nausea or vomiting.  ?  ? ?Allergies:   Morphine and related  ? ?Social History  ? ?Socioeconomic History  ? Marital status: Married  ?  Spouse name: Not on file  ? Number of children: Not on file  ? Years of education: Not on file  ? Highest education level: Not on file  ?Occupational History  ? Not on file  ?Tobacco Use  ? Smoking status: Never  ? Smokeless tobacco: Never  ?Vaping Use  ? Vaping Use: Never used  ?Substance and Sexual Activity  ? Alcohol use: Yes  ?  Alcohol/week: 3.0 standard drinks  ?  Types: 3 Glasses of wine per week  ?  Comment: weekends only  ?  Drug use: Never  ? Sexual activity: Yes  ?  Birth control/protection: Surgical  ?  Comment: Husband Vasectomy  ?Other Topics Concern  ? Not on file  ?Social History Narrative  ? Married  ? Work FT -- Teacher, English as a foreign language  ? 2 children (1 college, 1 high school)  ? Likes to travel  ? ?Social Determinants of Health  ? ?Financial Resource Strain: Not on file  ?Food Insecurity: Not on file  ?Transportation Needs: Not on file  ?Physical Activity: Not on file  ?Stress: Not on file  ?Social Connections: Not on file  ?  ? ?Family History: ?The patient's family history includes Breast cancer in her mother; Colon cancer in her mother; Congestive Heart Failure in her mother;  Hypertension in her father and mother; Kidney cancer in her paternal grandmother; Lung cancer in her maternal grandmother; Osteoarthritis in her father; Skin cancer in her brother. ? ?ROS:   ?Please see the history of present illness.    ?Review of Systems  ?Constitutional:  Negative for fever and malaise/fatigue.  ?HENT:  Negative for congestion and sore throat.   ?Eyes:  Negative for blurred vision.  ?Respiratory:  Negative for cough and shortness of breath.   ?Cardiovascular:  Negative for chest pain, palpitations, orthopnea, claudication, leg swelling and PND.  ?Gastrointestinal:  Negative for heartburn and nausea.  ?Genitourinary:  Negative for dysuria and urgency.  ?Musculoskeletal:  Negative for joint pain and myalgias.  ?Skin:  Negative for itching and rash.  ?Neurological:  Negative for dizziness and headaches.  ?Endo/Heme/Allergies:  Does not bruise/bleed easily.  ?Psychiatric/Behavioral:  The patient is not nervous/anxious and does not have insomnia.   ?All other systems reviewed and are negative. ? ?EKGs/Labs/Other Studies Reviewed:   ? ?The following studies were reviewed today: ?Ca Score 11/25/21: ?FINDINGS: ?Non-cardiac: See separate report from [facility]. ?  ?Ascending Aorta: Normal caliber. ?  ?Pericardium: Normal. ?  ?Coronary arteries: Normal origins. ?  ?Coronary Calcium Score: ?  ?Left main: 0 ?  ?Left anterior descending artery: 21 ?  ?Left circumflex artery: 0 ?  ?Right coronary artery: 0 ?  ?Ramus intermedius artery: 0 ?  ?Total: 21 ?  ?Percentile: 92nd for age, sex, and race matched control. ?  ?IMPRESSION: ?1. Coronary calcium score of 21. This was 92nd percentile for age, ?gender, and race matched controls. ?  ? ?EKG:   ?12/17/21: Sinus rhythm, rate 73 bpm ? ?Recent Labs: ?11/01/2021: BUN 14; Creatinine, Ser 0.84; Hemoglobin 14.3; Platelets 282.0; Potassium 4.1; Sodium 135 ?11/21/2021: ALT 16  ?Recent Lipid Panel ?   ?Component Value Date/Time  ? CHOL 220 (H) 11/01/2021 1132  ? TRIG  95.0 11/01/2021 1132  ? HDL 64.80 11/01/2021 1132  ? CHOLHDL 3 11/01/2021 1132  ? VLDL 19.0 11/01/2021 1132  ? LDLCALC 136 (H) 11/01/2021 1132  ? ? ? ?Risk Assessment/Calculations:   ?  ? ?    ? ?Physical Exam:   ? ?VS:  BP 122/80   Pulse 73   Ht 5' 3.5" (1.613 m)   Wt 202 lb 9.6 oz (91.9 kg)   SpO2 96%   BMI 35.33 kg/m?    ? ?Wt Readings from Last 3 Encounters:  ?12/17/21 202 lb 9.6 oz (91.9 kg)  ?11/01/21 198 lb 8 oz (90 kg)  ?11/30/20 215 lb (97.5 kg)  ?  ? ?GEN: Well nourished, well developed in no acute distress ?HEENT: Normal ?NECK: No JVD; No carotid bruits ?LYMPHATICS: No lymphadenopathy ?CARDIAC: RRR, no murmurs, rubs, gallops ?RESPIRATORY:  Clear to auscultation without rales, wheezing or rhonchi  ?ABDOMEN: Soft, non-tender, non-distended ?MUSCULOSKELETAL:  No edema; No deformity  ?SKIN: Warm and dry ?NEUROLOGIC:  Alert and oriented x 3 ?PSYCHIATRIC:  Normal affect  ? ?ASSESSMENT:   ? ?1. Coronary artery disease involving native coronary artery of native heart without angina pectoris   ?2. Medication management   ?3. Hyperlipidemia, unspecified hyperlipidemia type   ? ?PLAN:   ? ?In order of problems listed above: ? ?#Coronary Artery Disease: ?Ca score 21 which is the 92% for age, gender, and race matched controls. Currently doing well with no anginal symptoms.  ?-Continue lipitor 20mg  daily ?-Check lipids in 6 weeks ?-Lifestyle modifications as below ? ?#HTN: ?Well controlled and at goal.  ?-Continue HCTZ 12.5mg  daily ?-Continue lisinopril 40mg  daily ? ?Exercise recommendations: ?Goal of exercising for at least 30 minutes a day, at least 5 times per week.  Please exercise to a moderate exertion.  This means that while exercising it is difficult to speak in full sentences, however you are not so short of breath that you feel you must stop, and not so comfortable that you can carry on a full conversation.  Exertion level should be approximately a 5/10, if 10 is the most exertion you can  perform. ? ?Diet recommendations: ?Recommend a heart healthy diet such as the Mediterranean diet.  This diet consists of plant based foods, healthy fats, lean meats, olive oil.  It suggests limiting the intake of simple

## 2021-12-17 ENCOUNTER — Encounter: Payer: Self-pay | Admitting: Cardiology

## 2021-12-17 ENCOUNTER — Ambulatory Visit (INDEPENDENT_AMBULATORY_CARE_PROVIDER_SITE_OTHER): Payer: BC Managed Care – PPO | Admitting: Cardiology

## 2021-12-17 ENCOUNTER — Other Ambulatory Visit: Payer: Self-pay

## 2021-12-17 VITALS — BP 122/80 | HR 73 | Ht 63.5 in | Wt 202.6 lb

## 2021-12-17 DIAGNOSIS — I2510 Atherosclerotic heart disease of native coronary artery without angina pectoris: Secondary | ICD-10-CM

## 2021-12-17 DIAGNOSIS — E785 Hyperlipidemia, unspecified: Secondary | ICD-10-CM | POA: Diagnosis not present

## 2021-12-17 DIAGNOSIS — Z79899 Other long term (current) drug therapy: Secondary | ICD-10-CM | POA: Diagnosis not present

## 2021-12-17 NOTE — Patient Instructions (Signed)
Medication Instructions:  ? ?Your physician recommends that you continue on your current medications as directed. Please refer to the Current Medication list given to you today. ? ?*If you need a refill on your cardiac medications before your next appointment, please call your pharmacy* ? ? ?Lab Work: ? ?IN 6 WEEKS HERE IN THE OFFICE--CHECK LIPIDS--PLEASE COME FASTING TO THIS LAB APPOINTMENT ? ?If you have labs (blood work) drawn today and your tests are completely normal, you will receive your results only by: ?MyChart Message (if you have MyChart) OR ?A paper copy in the mail ?If you have any lab test that is abnormal or we need to change your treatment, we will call you to review the results. ? ? ?Follow-Up: ?At [facility], you and your health needs are our priority.  As part of our continuing mission to provide you with exceptional heart care, we have created designated Provider Care Teams.  These Care Teams include your primary Cardiologist (physician) and Advanced Practice Providers (APPs -  Physician Assistants and Nurse Practitioners) who all work together to provide you with the care you need, when you need it. ? ?We recommend signing up for the patient portal called "MyChart".  Sign up information is provided on this After Visit Summary.  MyChart is used to connect with patients for Virtual Visits (Telemedicine).  Patients are able to view lab/test results, encounter notes, upcoming appointments, etc.  Non-urgent messages can be sent to your provider as well.   ?To learn more about what you can do with MyChart, go to [email or web address].   ? ?Your next appointment:   ?1 year(s) ? ?The format for your next appointment:   ?In Person ? ?Provider:   ?DR. PEMBERTON ? ?

## 2021-12-23 ENCOUNTER — Encounter: Payer: Self-pay | Admitting: Physician Assistant

## 2021-12-27 ENCOUNTER — Encounter: Payer: Self-pay | Admitting: Physician Assistant

## 2021-12-27 ENCOUNTER — Ambulatory Visit (HOSPITAL_BASED_OUTPATIENT_CLINIC_OR_DEPARTMENT_OTHER)
Admission: RE | Admit: 2021-12-27 | Discharge: 2021-12-27 | Disposition: A | Discharge status: Home / Self Care | Payer: BC Managed Care – PPO | Source: Ambulatory Visit | Attending: Physician Assistant | Admitting: Physician Assistant

## 2021-12-27 ENCOUNTER — Ambulatory Visit (INDEPENDENT_AMBULATORY_CARE_PROVIDER_SITE_OTHER): Payer: BC Managed Care – PPO | Admitting: Physician Assistant

## 2021-12-27 VITALS — BP 110/80 | HR 71 | Temp 97.0°F | Ht 63.5 in | Wt 204.0 lb

## 2021-12-27 DIAGNOSIS — R1011 Right upper quadrant pain: Secondary | ICD-10-CM | POA: Insufficient documentation

## 2021-12-27 DIAGNOSIS — R1013 Epigastric pain: Secondary | ICD-10-CM | POA: Diagnosis not present

## 2021-12-27 DIAGNOSIS — Z9049 Acquired absence of other specified parts of digestive tract: Secondary | ICD-10-CM | POA: Diagnosis not present

## 2021-12-27 LAB — COMPREHENSIVE METABOLIC PANEL
ALT: 20 U/L (ref 0–35)
AST: 19 U/L (ref 0–37)
Albumin: 4.5 g/dL (ref 3.5–5.2)
Alkaline Phosphatase: 51 U/L (ref 39–117)
BUN: 11 mg/dL (ref 6–23)
CO2: 24 mEq/L (ref 19–32)
Calcium: 9.4 mg/dL (ref 8.4–10.5)
Chloride: 106 mEq/L (ref 96–112)
Creatinine, Ser: 0.74 mg/dL (ref 0.40–1.20)
GFR: 93.86 mL/min (ref >60.00)
Glucose, Bld: 103 mg/dL — ABNORMAL HIGH (ref 70–99)
Potassium: 3.6 mEq/L (ref 3.5–5.1)
Sodium: 139 mEq/L (ref 135–145)
Total Bilirubin: 1.2 mg/dL (ref 0.2–1.2)
Total Protein: 6.7 g/dL (ref 6.0–8.3)

## 2021-12-27 LAB — CBC WITH DIFFERENTIAL/PLATELET
Basophils Absolute: 0.1 10*3/uL (ref 0.0–0.1)
Basophils Relative: 1.2 % (ref 0.0–3.0)
Eosinophils Absolute: 0.1 10*3/uL (ref 0.0–0.7)
Eosinophils Relative: 2 % (ref 0.0–5.0)
HCT: 42.4 % (ref 36.0–46.0)
Hemoglobin: 14.3 g/dL (ref 12.0–15.0)
Lymphocytes Relative: 33.2 % (ref 12.0–46.0)
Lymphs Abs: 1.8 10*3/uL (ref 0.7–4.0)
MCHC: 33.7 g/dL (ref 30.0–36.0)
MCV: 92.2 fl (ref 78.0–100.0)
Monocytes Absolute: 0.2 10*3/uL (ref 0.1–1.0)
Monocytes Relative: 3.7 % (ref 3.0–12.0)
Neutro Abs: 3.3 10*3/uL (ref 1.4–7.7)
Neutrophils Relative %: 59.9 % (ref 43.0–77.0)
Platelets: 248 10*3/uL (ref 150.0–400.0)
RBC: 4.59 Mil/uL (ref 3.87–5.11)
RDW: 12.7 % (ref 11.5–15.5)
WBC: 5.6 10*3/uL (ref 4.0–10.5)

## 2021-12-27 LAB — H. PYLORI ANTIBODY, IGG: H Pylori IgG: NEGATIVE

## 2021-12-27 LAB — LIPASE: Lipase: 19 U/L (ref 11.0–59.0)

## 2021-12-27 LAB — POCT URINE PREGNANCY: Preg Test, Ur: NEGATIVE

## 2021-12-27 MED ORDER — PANTOPRAZOLE SODIUM 40 MG PO TBEC: 40.0000 mg | DELAYED_RELEASE_TABLET | Freq: Every day | ORAL | 0 refills | Status: DC

## 2021-12-27 NOTE — Patient Instructions (Addendum)
It was great to see you! ? ?We are going to get blood work and urine tests today ? ?We are also going to go an ultrasound of your abdomen ? ?Please start 40 mg protonix daily -- this will help decrease the acid content of your stomach ? ?Abdominal Pain, Adult ?Many things can cause belly (abdominal) pain. Most times, belly pain is not dangerous. Many cases of belly pain can be watched and treated at home. Sometimes, though, belly pain is serious. Your doctor will try to find the cause of your belly pain. ?Follow these instructions at home: ?Medicines ?Take over-the-counter and prescription medicines only as told by your doctor. ?Do not take medicines that help you poop (laxatives) unless told by your doctor. ?General instructions ?Watch your belly pain for any changes. ?Drink enough fluid to keep your pee (urine) pale yellow. ?Keep all follow-up visits as told by your doctor. This is important. ?Contact a doctor if: ?Your belly pain changes or gets worse. ?You are not hungry, or you lose weight without trying. ?You are having trouble pooping (constipated) or have watery poop (diarrhea) for more than 2-3 days. ?You have pain when you pee or poop. ?Your belly pain wakes you up at night. ?Your pain gets worse with meals, after eating, or with certain foods. ?You are vomiting and cannot keep anything down. ?You have a fever. ?You have blood in your pee. ?Get help right away if: ?Your pain does not go away as soon as your doctor says it should. ?You cannot stop vomiting. ?Your pain is only in areas of your belly, such as the right side or the left lower part of the belly. ?You have bloody or black poop, or poop that looks like tar. ?You have very bad pain, cramping, or bloating in your belly. ?You have signs of not having enough fluid or water in your body (dehydration), such as: ?Dark pee, very little pee, or no pee. ?Cracked lips. ?Dry mouth. ?Sunken eyes. ?Sleepiness. ?Weakness. ?You have trouble breathing or chest  pain. ?Summary ?Many cases of belly pain can be watched and treated at home. ?Watch your belly pain for any changes. ?Take over-the-counter and prescription medicines only as told by your doctor. ?Contact a doctor if your belly pain changes or gets worse. ?Get help right away if you have very bad pain, cramping, or bloating in your belly. ?This information is not intended to replace advice given to you by your health care provider. Make sure you discuss any questions you have with your health care provider. ?Document Revised: 01/24/2019 Document Reviewed: 01/24/2019 ?Elsevier Patient Education ? [place]. ? ? ?Take care, ? ?Inda Coke PA-C  ?

## 2021-12-27 NOTE — Progress Notes (Signed)
Christine Webb is a 51 y.o. female here for a abdominal pain. ? ?History of Present Illness:  ? ?Chief Complaint  ?Patient presents with  ? Abdominal Pain  ?  Pt c/o ruq abd pain that feel like a burning and states she is tender near her breast bone.  ? ?RUQ Pain ?Christine Webb presents with c/o intermittent RUQ abdominal pain that has been onset for several months, but worsening within the past 4 days. States upon initial onset a couple of days ago, she felt a lump right below her sternum which was tender upon palpation. Reports she then felt a radiation of pain to her RUQ close to her cholecystectomy incision. Upon further discussion, she shared she would feel a burning sensation in her RUQ upon walking but nothing that was significantly painful. At this time she is no longer able to palpitate a lump below her sternum. She does admit she was participating in yard work prior to worsening of sx. ? ?Denies fever, chills, excessive NSAID use, BM changes, nausea, vomiting, melena, hematochezia, SOB, urinary sx, hx of kidney stones, excessive alcohol intake, or concerns for pregnancy.  ? ?Past Medical History:  ?Diagnosis Date  ? Allergy   ? Arthritis   ? History of chicken pox   ? History of shingles   ? Hypertension   ? Iron deficiency anemia   ? Ablation; did require transfusions  ? ?  ?Social History  ? ?Tobacco Use  ? Smoking status: Never  ? Smokeless tobacco: Never  ?Vaping Use  ? Vaping Use: Never used  ?Substance Use Topics  ? Alcohol use: Yes  ?  Alcohol/week: 3.0 standard drinks  ?  Types: 3 Glasses of wine per week  ?  Comment: weekends only  ? Drug use: Never  ? ? ?Past Surgical History:  ?Procedure Laterality Date  ? CESAREAN SECTION  2002, 2004  ? CHOLECYSTECTOMY N/A 12/07/2020  ? Procedure: LAPAROSCOPIC CHOLECYSTECTOMY WITH INTRAOPERATIVE CHOLANGIOGRAM;  Surgeon: Darnell Level, MD;  Location: [facility];  Service: General;  Laterality: N/A;  ROOM 1 STARTING AT 07:30AM FOR 90 MIN  ? KNEE SURGERY  2012  ? 3 surgeries   ? ? ?Family History  ?Problem Relation Age of Onset  ? Hypertension Mother   ? Breast cancer Mother   ? Colon cancer Mother   ? Congestive Heart Failure Mother   ? Osteoarthritis Father   ? Hypertension Father   ? Skin cancer Brother   ? Lung cancer Maternal Grandmother   ? Kidney cancer Paternal Grandmother   ? ? ?Allergies  ?Allergen Reactions  ? Morphine And Related   ?  Pt preference - makes pt really itchy   ? ? ?Current Medications:  ? ?Current Outpatient Medications:  ?  atorvastatin (LIPITOR) 20 MG tablet, Take 1 tablet (20 mg total) by mouth daily., Disp: 90 tablet, Rfl: 1 ?  cetirizine (ZYRTEC) 10 MG tablet, Take 10 mg by mouth daily as needed for allergies., Disp: , Rfl:  ?  hydrochlorothiazide (MICROZIDE) 12.5 MG capsule, TAKE 1 CAPSULE BY MOUTH EVERY DAY, Disp: 90 capsule, Rfl: 0 ?  lisinopril (ZESTRIL) 40 MG tablet, TAKE 1 TABLET BY MOUTH EVERY DAY, Disp: 90 tablet, Rfl: 3 ?  meclizine (ANTIVERT) 12.5 MG tablet, Take 1 tablet (12.5 mg total) by mouth 3 (three) times daily as needed for dizziness., Disp: 30 tablet, Rfl: 0 ?  ondansetron (ZOFRAN) 4 MG tablet, Take 1 tablet (4 mg total) by mouth every 8 (eight) hours as needed for nausea  or vomiting., Disp: 30 tablet, Rfl: 1 ?  pantoprazole (PROTONIX) 40 MG tablet, Take 1 tablet (40 mg total) by mouth daily., Disp: 30 tablet, Rfl: 0  ? ?Review of Systems:  ? ?Review of Systems  ?Gastrointestinal:  Positive for abdominal pain.  ? ?Vitals:  ? ?Vitals:  ? 12/27/21 0818  ?BP: 110/80  ?Pulse: 71  ?Temp: (!) 97 ?F (36.1 ?C)  ?SpO2: 99%  ?Weight: 204 lb (92.5 kg)  ?Height: 5' 3.5" (1.613 m)  ?   ?Body mass index is 35.57 kg/m?. ? ?Physical Exam:  ? ?Physical Exam ?Vitals and nursing note reviewed.  ?Constitutional:   ?   General: She is not in acute distress. ?   Appearance: She is well-developed. She is not ill-appearing or toxic-appearing.  ?Cardiovascular:  ?   Rate and Rhythm: Normal rate and regular rhythm.  ?   Pulses: Normal pulses.  ?   Heart sounds:  Normal heart sounds, S1 normal and S2 normal.  ?Pulmonary:  ?   Effort: Pulmonary effort is normal.  ?   Breath sounds: Normal breath sounds.  ?Abdominal:  ?   General: Abdomen is flat. Bowel sounds are decreased.  ?   Palpations: Abdomen is soft.  ?   Tenderness: There is no abdominal tenderness. There is no right CVA tenderness, left CVA tenderness, guarding or rebound. Negative signs include Murphy's sign and Rovsing's sign.  ?Skin: ?   General: Skin is warm and dry.  ?Neurological:  ?   Mental Status: She is alert.  ?   GCS: GCS eye subscore is 4. GCS verbal subscore is 5. GCS motor subscore is 6.  ?Psychiatric:     ?   Speech: Speech normal.     ?   Behavior: Behavior normal. Behavior is cooperative.  ? ? ?Assessment and Plan:  ? ?RUQ pain ?No red flags -- no evidence of acute abdomen ?DDX includes: PUD, gastritis, IBS, pancreatitis, among several others ?Update labs, will make recommendations accordingly  ?Ordered RUQ U/S for further evaluation ?Start protonix 40 mg daily  ? ?I,Havlyn C Ratchford,acting as a scribe for [employer], PA.,have documented all relevant documentation on the behalf of Jarold Motto, PA,as directed by  Jarold Motto, PA while in the presence of Jarold Motto, [place]. ? ?IJarold Motto, PA, have reviewed all documentation for this visit. The documentation on 12/27/21 for the exam, diagnosis, procedures, and orders are all accurate and complete. ? ? ?Jarold Motto, PA-C ? ?

## 2022-01-23 ENCOUNTER — Other Ambulatory Visit: Payer: Self-pay | Admitting: Physician Assistant

## 2022-01-31 ENCOUNTER — Other Ambulatory Visit: Payer: BC Managed Care – PPO | Admitting: *Deleted

## 2022-01-31 DIAGNOSIS — E785 Hyperlipidemia, unspecified: Secondary | ICD-10-CM | POA: Diagnosis not present

## 2022-01-31 DIAGNOSIS — Z79899 Other long term (current) drug therapy: Secondary | ICD-10-CM | POA: Diagnosis not present

## 2022-01-31 LAB — LIPID PANEL
Chol/HDL Ratio: 2.6 ratio (ref 0.0–4.4)
Cholesterol, Total: 165 mg/dL (ref 100–199)
HDL: 64 mg/dL (ref >39)
LDL Chol Calc (NIH): 86 mg/dL (ref 0–99)
Triglycerides: 81 mg/dL (ref 0–149)
VLDL Cholesterol Cal: 15 mg/dL (ref 5–40)

## 2022-02-06 ENCOUNTER — Other Ambulatory Visit: Payer: Self-pay | Admitting: Physician Assistant

## 2022-02-10 ENCOUNTER — Encounter: Payer: Self-pay | Admitting: *Deleted

## 2022-02-13 ENCOUNTER — Other Ambulatory Visit: Payer: Self-pay | Admitting: Physician Assistant

## 2022-03-03 ENCOUNTER — Other Ambulatory Visit (HOSPITAL_COMMUNITY): Payer: Self-pay

## 2022-04-18 ENCOUNTER — Encounter: Payer: Self-pay | Admitting: Orthopedic Surgery

## 2022-04-18 ENCOUNTER — Ambulatory Visit (INDEPENDENT_AMBULATORY_CARE_PROVIDER_SITE_OTHER): Payer: BC Managed Care – PPO | Admitting: Orthopedic Surgery

## 2022-04-18 ENCOUNTER — Ambulatory Visit (INDEPENDENT_AMBULATORY_CARE_PROVIDER_SITE_OTHER): Payer: BC Managed Care – PPO

## 2022-04-18 VITALS — Ht 63.5 in | Wt 200.0 lb

## 2022-04-18 DIAGNOSIS — M25561 Pain in right knee: Secondary | ICD-10-CM | POA: Diagnosis not present

## 2022-04-18 NOTE — Progress Notes (Signed)
Office Visit Note   Patient: Christine Webb           Date of Birth: 1971-01-23           MRN: [id number] Visit Date: 04/18/2022 Requested by: Jarold Motto, [place] [address] [place],  [place] [address] PCP: Jarold Motto, [place]  Subjective: Chief Complaint  Patient presents with   Right Knee - Pain    HPI: Christine Webb is a 51 year old patient with right knee pain.  She has had 2 prior surgeries for knee injury she sustained about 12 years ago skiing.  This sounds like it was ACL reconstruction and MCL reconstruction.  Reports some popping which started 2 years ago but worse over the past 2 weeks.  Describes weakness but no giving way or locking.  She guards her knee on uneven ground.  Okay for her to walk flat ground.  She also has some sciatic issues but no groin symptoms.  Going on a 2-week vacation to [place] in September.  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              ROS: All systems reviewed are negative as they relate to the chief complaint within the history of present illness.  Patient denies  fevers or chills.   Assessment & Plan: Visit Diagnoses:  1. Right knee pain, unspecified chronicity     Plan: Impression is right knee pain with primarily medial sided pain.  There is no ACL laxity or posterior lateral corner laxity present.  MCL also feels good to valgus stress at 0 and 30 degrees.  Radiographs show mild degenerative changes.  Plan is MRI scan with likely injection into the knee joint about 5 days before her trip to [place].  Hard to say if this is a structural problem or just some occult arthritis.  No effusion today and the knee is stable.  Follow-Up Instructions: Return for after MRI.   Orders:  Orders Placed This Encounter  Procedures   XR KNEE 3 VIEW RIGHT   MR Knee Right w/o contrast   No orders of the defined types were placed in this encounter.     Procedures: No procedures performed   Clinical Data: No additional findings.  Objective: Vital  Signs: Ht 5' 3.5" (1.613 m)   Wt 200 lb (90.7 kg)   BMI 34.87 kg/m   Physical Exam:   Constitutional: Patient appears well-developed HEENT:  Head: Normocephalic Eyes:EOM are normal Neck: Normal range of motion Cardiovascular: Normal rate Pulmonary/chest: Effort normal Neurologic: Patient is alert Skin: Skin is warm Psychiatric: Patient has normal mood and affect   Ortho Exam: Ortho exam demonstrates right knee with good range of motion from 0-1 25.  Collaterals are stable to varus valgus stress at 0 and 30 degrees.  Extensor mechanism intact.  Patient has primarily anterior medial and medial joint line tenderness.  Well-healed surgical incisions from prior reconstructive surgeries present.  Ankle dorsiflexion intact.  Pedal pulses intact.  Specialty Comments:  No specialty comments available.  Imaging: XR KNEE 3 VIEW RIGHT  Result Date: 04/18/2022 AP lateral merchant radiographs right knee reviewed.  Evidence of prior ACL reconstruction is present with tunnels in good position.  Only mild medial and lateral joint space narrowing present with overall alignment intact.  No loose bodies.    PMFS History: Patient Active Problem List   Diagnosis Date Noted   Chronic vertigo 11/01/2021   Hypertension    Past Medical History:  Diagnosis Date   Allergy  Arthritis    History of chicken pox    History of shingles    Hypertension    Iron deficiency anemia    Ablation; did require transfusions    Family History  Problem Relation Age of Onset   Hypertension Mother    Breast cancer Mother    Colon cancer Mother    Congestive Heart Failure Mother    Osteoarthritis Father    Hypertension Father    Skin cancer Brother    Lung cancer Maternal Grandmother    Kidney cancer Paternal Grandmother     Past Surgical History:  Procedure Laterality Date   CESAREAN SECTION  2002, 2004   CHOLECYSTECTOMY N/A 12/07/2020   Procedure: LAPAROSCOPIC CHOLECYSTECTOMY WITH INTRAOPERATIVE  CHOLANGIOGRAM;  Surgeon: Darnell Level, MD;  Location: [facility];  Service: General;  Laterality: N/A;  ROOM 1 STARTING AT 07:30AM FOR 90 MIN   KNEE SURGERY  2012   3 surgeries   Social History   Occupational History   Not on file  Tobacco Use   Smoking status: Never   Smokeless tobacco: Never  Vaping Use   Vaping Use: Never used  Substance and Sexual Activity   Alcohol use: Yes    Alcohol/week: 3.0 standard drinks of alcohol    Types: 3 Glasses of wine per week    Comment: weekends only   Drug use: Never   Sexual activity: Yes    Birth control/protection: Surgical    Comment: Husband Vasectomy

## 2022-05-01 ENCOUNTER — Encounter: Payer: Self-pay | Admitting: Physician Assistant

## 2022-05-01 ENCOUNTER — Telehealth: Payer: Self-pay | Admitting: Physician Assistant

## 2022-05-01 ENCOUNTER — Telehealth (INDEPENDENT_AMBULATORY_CARE_PROVIDER_SITE_OTHER): Payer: BC Managed Care – PPO | Admitting: Physician Assistant

## 2022-05-01 VITALS — Ht 63.5 in | Wt 200.0 lb

## 2022-05-01 DIAGNOSIS — U071 COVID-19: Secondary | ICD-10-CM | POA: Diagnosis not present

## 2022-05-01 MED ORDER — NIRMATRELVIR/RITONAVIR (PAXLOVID)TABLET: 3.0000 | ORAL_TABLET | Freq: Two times a day (BID) | ORAL | 0 refills | Status: AC

## 2022-05-01 NOTE — Progress Notes (Signed)
Virtual Visit via Video   I connected with Christine Webb on 05/01/22 at 10:20 AM EDT by a video enabled telemedicine application and verified that I am speaking with the correct person using two identifiers. Location patient: Home Location provider: [facility], Office Persons participating in the virtual visit: Audrinna Sherman, Jarold Motto PA-C, Corky Mull, LPN   I discussed the limitations of evaluation and management by telemedicine and the availability of in person appointments. The patient expressed understanding and agreed to proceed.  I acted as a Neurosurgeon for [employer], PA-C [employer], LPN   Subjective:   HPI:   Patient is requesting evaluation for possible COVID-19.  Symptom onset: Tuesday 8/1  Travel/contacts: Pt was in [place] over the weekend   Vaccination status: 3 shots  Testing results: Positive home test this morning.  She has had COVID two other times.  Patient endorses the following symptoms: Nasal and chest congestion, cough, headache, chills, body aches  Patient denies the following symptoms: No fever, diarrhea, chest pain, SOB  Treatments tried: Advil cold and sinus this morning  Patient risk factors: Smoking status: Enrique Weiss  reports that she has never smoked. She has never used smokeless tobacco. If female, currently pregnant? []   Yes [x]   No  ROS: See pertinent positives and negatives per HPI.  Patient Active Problem List   Diagnosis Date Noted   Chronic vertigo 11/01/2021   Hypertension     Social History   Tobacco Use   Smoking status: Never   Smokeless tobacco: Never  Substance Use Topics   Alcohol use: Yes    Alcohol/week: 3.0 standard drinks of alcohol    Types: 3 Glasses of wine per week    Comment: weekends only    Current Outpatient Medications:    atorvastatin (LIPITOR) 20 MG tablet, Take 1 tablet (20 mg total) by mouth daily., Disp: 90 tablet, Rfl: 1   cetirizine (ZYRTEC) 10 MG tablet, Take 10  mg by mouth daily as needed for allergies., Disp: , Rfl:    hydrochlorothiazide (MICROZIDE) 12.5 MG capsule, TAKE 1 CAPSULE BY MOUTH EVERY DAY, Disp: 90 capsule, Rfl: 0   lisinopril (ZESTRIL) 40 MG tablet, TAKE 1 TABLET BY MOUTH EVERY DAY, Disp: 90 tablet, Rfl: 3   meclizine (ANTIVERT) 12.5 MG tablet, Take 1 tablet (12.5 mg total) by mouth 3 (three) times daily as needed for dizziness., Disp: 30 tablet, Rfl: 0   nirmatrelvir/ritonavir EUA (PAXLOVID) 20 x 150 MG & 10 x 100MG  TABS, Take 3 tablets by mouth 2 (two) times daily for 5 days. (Take nirmatrelvir 150 mg two tablets twice daily for 5 days and ritonavir 100 mg one tablet twice daily for 5 days), Disp: 30 tablet, Rfl: 0   ondansetron (ZOFRAN) 4 MG tablet, Take 1 tablet (4 mg total) by mouth every 8 (eight) hours as needed for nausea or vomiting., Disp: 30 tablet, Rfl: 1   pantoprazole (PROTONIX) 40 MG tablet, TAKE 1 TABLET BY MOUTH EVERY DAY, Disp: 90 tablet, Rfl: 1  Allergies  Allergen Reactions   Morphine And Related     Pt preference - makes pt really itchy     Objective:   VITALS: Per patient if applicable, see vitals. GENERAL: Alert, appears well and in no acute distress. HEENT: Atraumatic, conjunctiva clear, no obvious abnormalities on inspection of external nose and ears. NECK: Normal movements of the head and neck. CARDIOPULMONARY: No increased WOB. Speaking in clear sentences. I:E ratio WNL.  MS: Moves all visible extremities without  noticeable abnormality. PSYCH: Pleasant and cooperative, well-groomed. Speech normal rate and rhythm. Affect is appropriate. Insight and judgement are appropriate. Attention is focused, linear, and appropriate.  NEURO: CN grossly intact. Oriented as arrived to appointment on time with no prompting. Moves both UE equally.  SKIN: No obvious lesions, wounds, erythema, or cyanosis noted on face or hands.  Assessment and Plan:   Christine Webb was seen today for covid positive.  Diagnoses and all orders  for this visit:  COVID-19  Other orders -     nirmatrelvir/ritonavir EUA (PAXLOVID) 20 x 150 MG & 10 x 100MG  TABS; Take 3 tablets by mouth 2 (two) times daily for 5 days. (Take nirmatrelvir 150 mg two tablets twice daily for 5 days and ritonavir 100 mg one tablet twice daily for 5 days)   No red flags on discussion, patient is not in any obvious distress during our visit. Discussed progression of most viral illnesses, and recommended supportive care at this point in time.  She is interested in paxlovid - I have sent this in for her. Instructed her to hold lipitor 12 hours prior to starting and for a week after completing course.  Discussed over the counter supportive care options, including Tylenol 500 mg q 8 hours, with recommendations to push fluids and rest. Reviewed return precautions including new/worsening fever, SOB, new/worsening cough, sudden onset changes of symptoms. Recommended need to self-quarantine and practice social distancing until symptoms resolve. I recommend that patient follow-up if symptoms worsen or persist despite treatment x 7-10 days, sooner if needed.  I discussed the assessment and treatment plan with the patient. The patient was provided an opportunity to ask questions and all were answered. The patient agreed with the plan and demonstrated an understanding of the instructions.   The patient was advised to call back or seek an in-person evaluation if the symptoms worsen or if the condition fails to improve as anticipated.   CMA or LPN served as scribe during this visit. History, Physical, and Plan performed by medical provider. The above documentation has been reviewed and is accurate and complete.   [place], [facility] 05/01/2022

## 2022-05-02 ENCOUNTER — Other Ambulatory Visit: Payer: BC Managed Care – PPO

## 2022-05-02 NOTE — Telephone Encounter (Signed)
error 

## 2022-05-09 ENCOUNTER — Ambulatory Visit
Admission: RE | Admit: 2022-05-09 | Discharge: 2022-05-09 | Disposition: A | Discharge status: Home / Self Care | Payer: BC Managed Care – PPO | Source: Ambulatory Visit | Attending: Orthopedic Surgery | Admitting: Orthopedic Surgery

## 2022-05-09 ENCOUNTER — Ambulatory Visit: Payer: BC Managed Care – PPO | Admitting: Orthopedic Surgery

## 2022-05-09 DIAGNOSIS — M25561 Pain in right knee: Secondary | ICD-10-CM

## 2022-05-09 DIAGNOSIS — Z9889 Other specified postprocedural states: Secondary | ICD-10-CM | POA: Diagnosis not present

## 2022-05-13 ENCOUNTER — Other Ambulatory Visit: Payer: Self-pay | Admitting: Physician Assistant

## 2022-05-19 ENCOUNTER — Encounter: Payer: Self-pay | Admitting: Orthopedic Surgery

## 2022-05-19 ENCOUNTER — Ambulatory Visit (INDEPENDENT_AMBULATORY_CARE_PROVIDER_SITE_OTHER): Payer: BC Managed Care – PPO | Admitting: Orthopedic Surgery

## 2022-05-19 DIAGNOSIS — M1711 Unilateral primary osteoarthritis, right knee: Secondary | ICD-10-CM

## 2022-05-19 DIAGNOSIS — M25561 Pain in right knee: Secondary | ICD-10-CM

## 2022-05-19 MED ORDER — BUPIVACAINE HCL 0.25 % IJ SOLN
4.0000 mL | INTRAMUSCULAR | Status: AC | PRN
  Administered 2022-05-19: 4 mL via INTRA_ARTICULAR

## 2022-05-19 MED ORDER — METHYLPREDNISOLONE ACETATE 40 MG/ML IJ SUSP
40.0000 mg | INTRAMUSCULAR | Status: AC | PRN
  Administered 2022-05-19: 40 mg via INTRA_ARTICULAR

## 2022-05-19 MED ORDER — LIDOCAINE HCL 1 % IJ SOLN
5.0000 mL | INTRAMUSCULAR | Status: AC | PRN
  Administered 2022-05-19: 5 mL

## 2022-05-19 NOTE — Progress Notes (Signed)
Office Visit Note   Patient: Christine Webb           Date of Birth: 1971-04-20           MRN: [id number] Visit Date: 05/19/2022 Requested by: Jarold Motto, [place] [address] [place],  [place] [address] PCP: Jarold Motto, [place]  Subjective: Chief Complaint  Patient presents with   Other    Right knee Scan review    HPI: Christine Webb is a 51 year old patient with right knee pain.  Here for MRI scan review.  Her son moved into [facility] today and she and her husband are planning a trip to [place] in about 3 weeks.  She reports continued pain with ambulation particularly uphill.  MRI scan is reviewed and.  It shows intact ACL graft with lateral patellofemoral arthritis along with medial tibiofemoral arthritis which is shown to the patient.  Denies any instability.  Really only has pain with activity.  Takes occasional over-the-counter medication for the problem.              ROS: All systems reviewed are negative as they relate to the chief complaint within the history of present illness.  Patient denies  fevers or chills.   Assessment & Plan: Visit Diagnoses:  1. Arthritis of right knee     Plan: Impression is early right knee arthritis.  Continue with nonweightbearing quad strengthening exercises.  She typically does well with the elliptical and walking on a treadmill but not uphill.  Injection performed today of cortisone.  Would like to preapproved her for gel shot and she can come back for that when this cortisone shot wears off. This patient is diagnosed with osteoarthritis of the knee(s).    Radiographs show evidence of joint space narrowing, osteophytes, subchondral sclerosis and/or subchondral cysts.  This patient has knee pain which interferes with functional and activities of daily living.    This patient has experienced inadequate response, adverse effects and/or intolerance with conservative treatments such as acetaminophen, NSAIDS, topical creams, physical therapy or  regular exercise, knee bracing and/or weight loss.   This patient has experienced inadequate response or has a contraindication to intra articular steroid injections for at least 3 months.   This patient is not scheduled to have a total knee replacement within 6 months of starting treatment with viscosupplementation.   Follow-Up Instructions: No follow-ups on file.   Orders:  No orders of the defined types were placed in this encounter.  No orders of the defined types were placed in this encounter.     Procedures: Large Joint Inj: R knee on 05/19/2022 9:56 PM Indications: diagnostic evaluation, joint swelling and pain Details: 18 G 1.5 in needle, superolateral approach  Arthrogram: No  Medications: 5 mL lidocaine 1 %; 40 mg methylPREDNISolone acetate 40 MG/ML; 4 mL bupivacaine 0.25 % Outcome: tolerated well, no immediate complications Procedure, treatment alternatives, risks and benefits explained, specific risks discussed. Consent was given by the patient. Immediately prior to procedure a time out was called to verify the correct patient, procedure, equipment, support staff and site/side marked as required. Patient was prepped and draped in the usual sterile fashion.       Clinical Data: No additional findings.  Objective: Vital Signs: LMP 04/25/2022 (Exact Date)   Physical Exam:   Constitutional: Patient appears well-developed HEENT:  Head: Normocephalic Eyes:EOM are normal Neck: Normal range of motion Cardiovascular: Normal rate Pulmonary/chest: Effort normal Neurologic: Patient is alert Skin: Skin is warm Psychiatric: Patient has normal mood  and affect   Ortho Exam: Ortho exam demonstrates well-healed incisions around the knee.  Collateral crucial ligaments are stable.  No effusion is present.  Range of motion is excellent 0 to about 120.  No postop rotatory instability is noted.  Pedal pulses palpable.  No groin pain on the right with internal/external rotation  of the leg.  Specialty Comments:  No specialty comments available.  Imaging: No results found.   PMFS History: Patient Active Problem List   Diagnosis Date Noted   Chronic vertigo 11/01/2021   Hypertension    Past Medical History:  Diagnosis Date   Allergy    Arthritis    History of chicken pox    History of shingles    Hypertension    Iron deficiency anemia    Ablation; did require transfusions    Family History  Problem Relation Age of Onset   Hypertension Mother    Breast cancer Mother    Colon cancer Mother    Congestive Heart Failure Mother    Osteoarthritis Father    Hypertension Father    Skin cancer Brother    Lung cancer Maternal Grandmother    Kidney cancer Paternal Grandmother     Past Surgical History:  Procedure Laterality Date   CESAREAN SECTION  2002, 2004   CHOLECYSTECTOMY N/A 12/07/2020   Procedure: LAPAROSCOPIC CHOLECYSTECTOMY WITH INTRAOPERATIVE CHOLANGIOGRAM;  Surgeon: Darnell Level, MD;  Location: [facility];  Service: General;  Laterality: N/A;  ROOM 1 STARTING AT 07:30AM FOR 90 MIN   KNEE SURGERY  2012   3 surgeries   Social History   Occupational History   Not on file  Tobacco Use   Smoking status: Never   Smokeless tobacco: Never  Vaping Use   Vaping Use: Never used  Substance and Sexual Activity   Alcohol use: Yes    Alcohol/week: 3.0 standard drinks of alcohol    Types: 3 Glasses of wine per week    Comment: weekends only   Drug use: Never   Sexual activity: Yes    Birth control/protection: Surgical    Comment: Husband Vasectomy

## 2022-05-20 IMAGING — CT CT CARDIAC CORONARY ARTERY CALCIUM SCORE
3 series · 14 of 20 positions shown, 16 images · non-contrast
Comparison: None.

Addendum:
CLINICAL DATA: Risk stratification: 51 Year-old White Female

EXAM:
Coronary Calcium Score
TECHNIQUE: The patient was scanned on a Siemens Force scanner. Axial
non-contrast 3 mm slices were carried out through the heart. The
data set was analyzed on a dedicated work station and scored using
the Agatson method.

[Series 2: cascseq 2.0 sa36 70% (id) · axial · 0.39mm/px · z∈[-232,-152]mm · 4 of 68 slices shown]
[im 14/68  vessel]
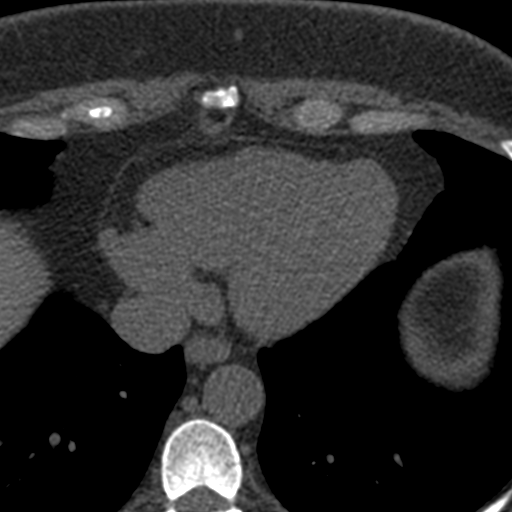
[im 27/68  vessel]
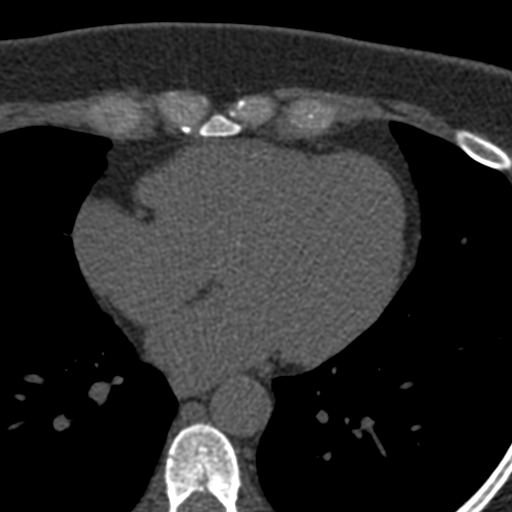
[im 41/68  vessel]
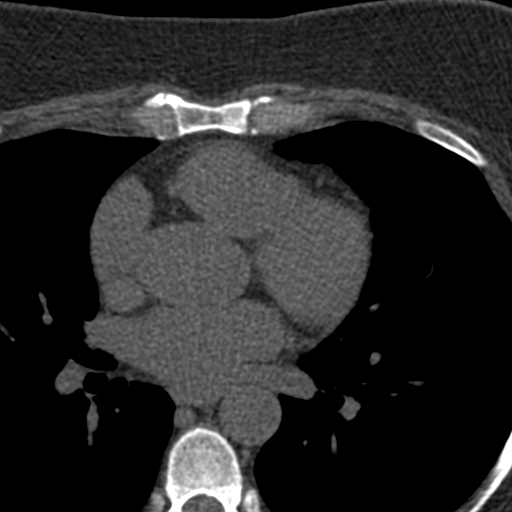
[im 54/68  vessel]
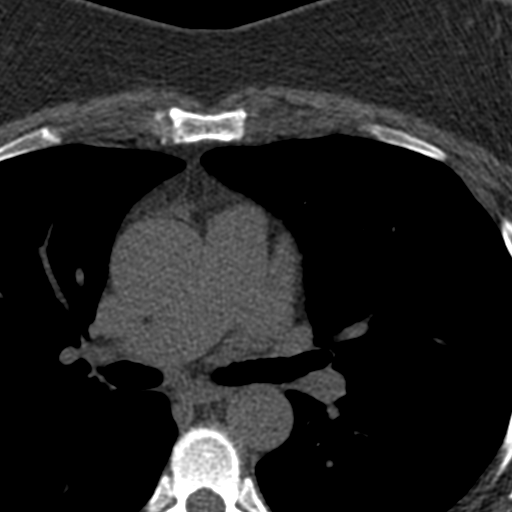

[Series 3: cascseq 2.0 bf37 st · axial · 0.73mm/px · z∈[-236,-148]mm · 5 of 68 slices shown, 7 images]
[im 12/68  vessel]
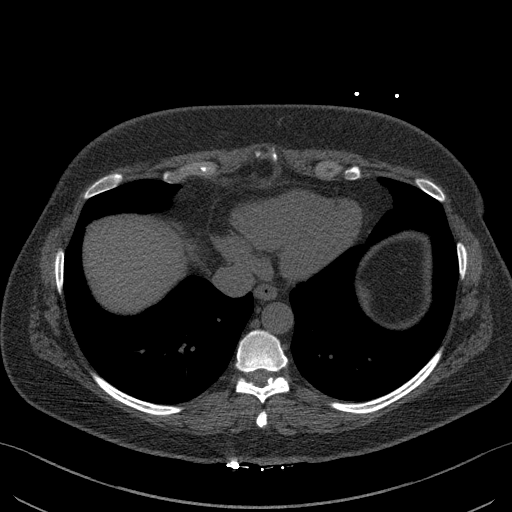
[im 12/68  lung]
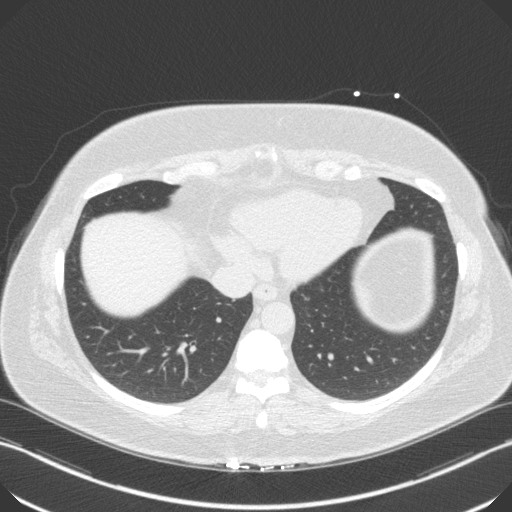
[im 23/68  vessel]
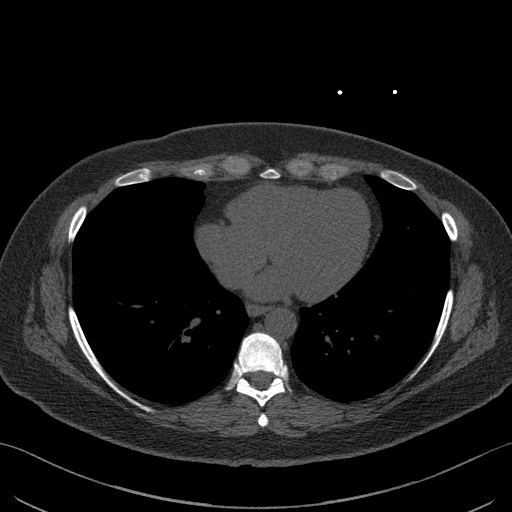
[im 34/68  vessel]
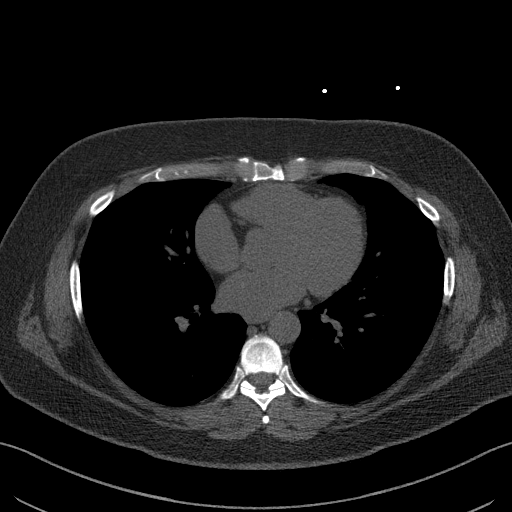
[im 45/68  vessel]
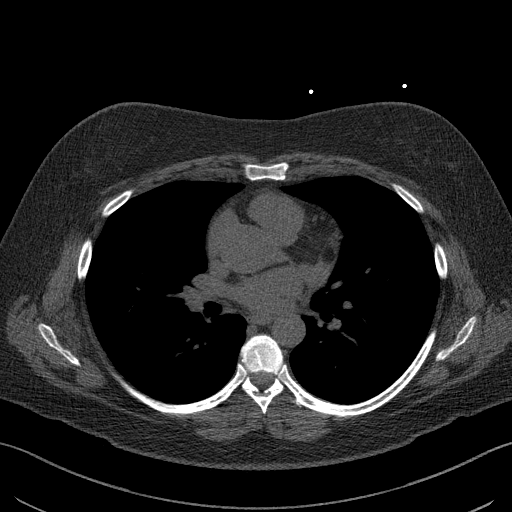
[im 56/68  vessel]
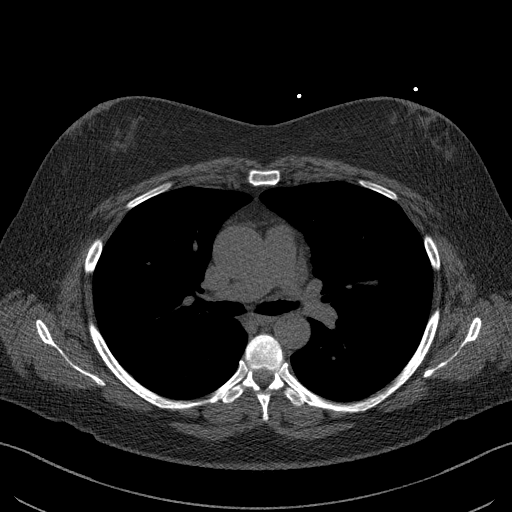
[im 56/68  lung]
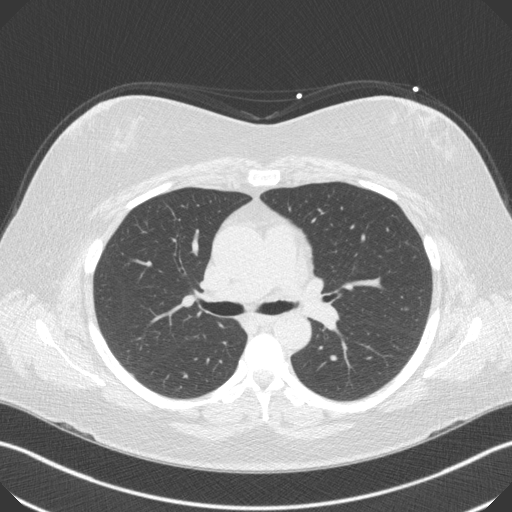

[Series 4: cascseq 2.0 br59 lung · axial · 0.73mm/px · z∈[-236,-148]mm · 5 of 68 slices shown]
[im 12/68  lung]
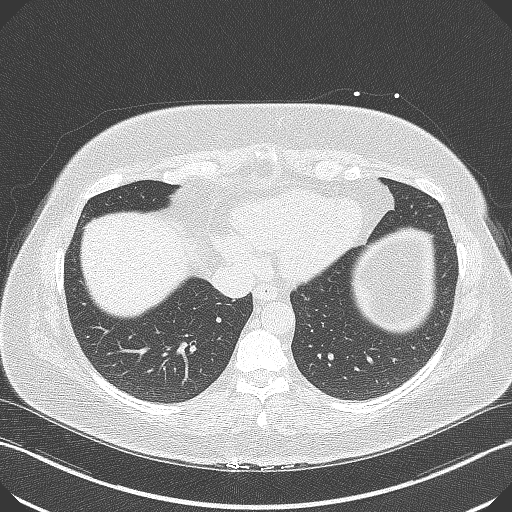
[im 23/68  lung]
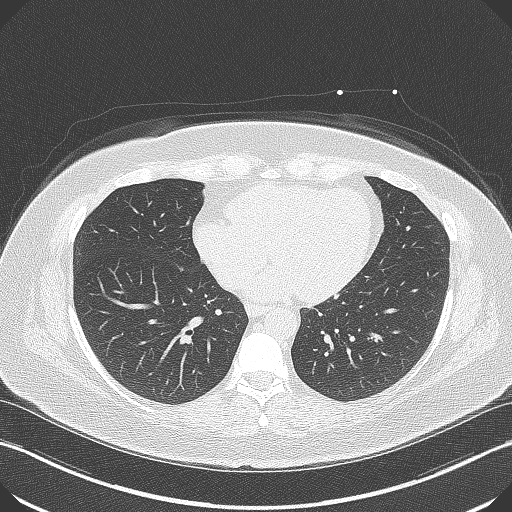
[im 34/68  lung]
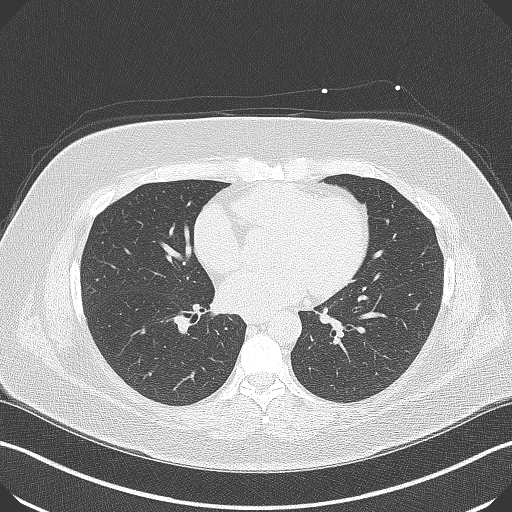
[im 45/68  lung]
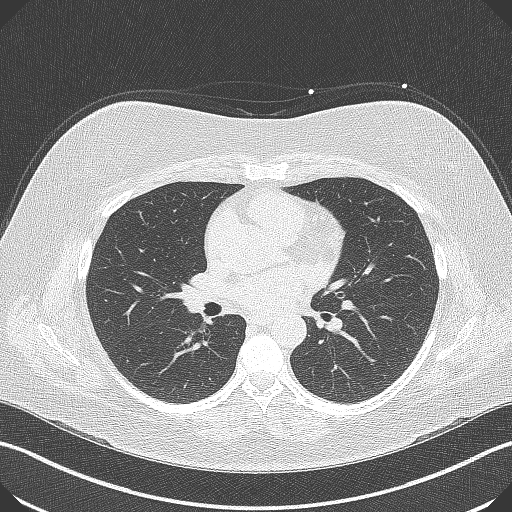
[im 56/68  lung]
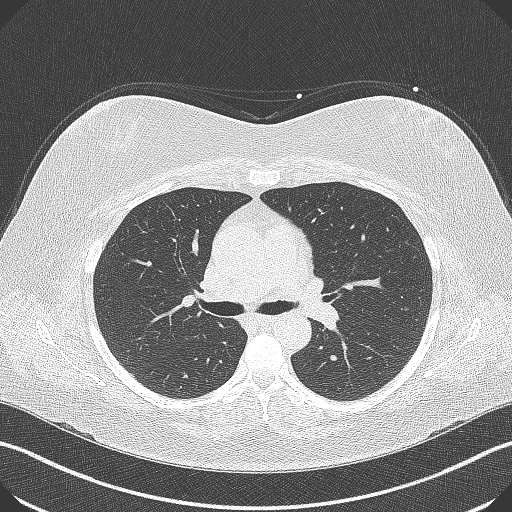

[14 of 20 positions shown; findings below may reference images not displayed]

FINDINGS: Non-cardiac: See separate report from [REDACTED].

Ascending Aorta: Normal caliber.

Pericardium: Normal.

Coronary arteries: Normal origins.

Coronary Calcium Score:

Left main: 0

Left anterior descending artery: 21

Left circumflex artery: 0

Right coronary artery: 0

Ramus intermedius artery: 0

Total: 21

Percentile: 92nd for age, sex, and race matched control.
IMPRESSION: 1. Coronary calcium score of 21. This was 92nd percentile for age,
gender, and race matched controls.

RECOMMENDATIONS:



If CAC = 0, it is reasonable to withhold statin therapy and reassess
in 5 to 10 years, as long as higher risk conditions are absent
(diabetes mellitus, family history of premature CHD in first degree
relatives (males <55 years; females <65 years), cigarette smoking,
LDL >=190 mg/dL or other independent risk factors).

If CAC is 1 to 99, it is reasonable to initiate statin therapy for
patients >=55 years of age.

If CAC is >=100 or >=75th percentile, it is reasonable to initiate
statin therapy at any age.

Cardiology referral should be considered for patients with CAC
scores =400 or >=75th percentile.

*1696 AHA/ACC/AACVPR/AAPA/ABC/SANTANDER/SILVJO/SEHAR/Faren/MATIEKUS/TOVI/DECHKO
Guideline on the Management of Blood Cholesterol: A Report of the
American College of Cardiology/American Heart Association Task Force
on Clinical Practice Guidelines. J Am Coll Cardiol.
5665;73(24):3103-3274.

EXAM:
OVER-READ INTERPRETATION  CT CHEST

The following report is an over-read performed by radiologist Dr.
over-read does not include interpretation of cardiac or coronary
anatomy or pathology. The coronary calcium score interpretation by
the cardiologist is attached.
FINDINGS: No significant noncardiac vascular findings. Visualized mediastinum
and hilar regions demonstrate no lymphadenopathy or masses.
Visualized lungs show no evidence of pulmonary edema, consolidation,
pneumothorax, nodule or pleural fluid. Visualized upper abdomen and
bony structures are unremarkable.
IMPRESSION: No significant incidental findings.

*** End of Addendum ***
FINDINGS: Non-cardiac: See separate report from [REDACTED].

Ascending Aorta: Normal caliber.

Pericardium: Normal.

Coronary arteries: Normal origins.

Coronary Calcium Score:

Left main: 0

Left anterior descending artery: 21

Left circumflex artery: 0

Right coronary artery: 0

Ramus intermedius artery: 0

Total: 21

Percentile: 92nd for age, sex, and race matched control.
IMPRESSION: 1. Coronary calcium score of 21. This was 92nd percentile for age,
gender, and race matched controls.

RECOMMENDATIONS:



If CAC = 0, it is reasonable to withhold statin therapy and reassess
in 5 to 10 years, as long as higher risk conditions are absent
(diabetes mellitus, family history of premature CHD in first degree
relatives (males <55 years; females <65 years), cigarette smoking,
LDL >=190 mg/dL or other independent risk factors).

If CAC is 1 to 99, it is reasonable to initiate statin therapy for
patients >=55 years of age.

If CAC is >=100 or >=75th percentile, it is reasonable to initiate
statin therapy at any age.

Cardiology referral should be considered for patients with CAC
scores =400 or >=75th percentile.

*1696 AHA/ACC/AACVPR/AAPA/ABC/SANTANDER/SILVJO/SEHAR/Faren/MATIEKUS/TOVI/DECHKO
Guideline on the Management of Blood Cholesterol: A Report of the
American College of Cardiology/American Heart Association Task Force
on Clinical Practice Guidelines. J Am Coll Cardiol.
5665;73(24):3103-3274.

## 2022-05-20 NOTE — Progress Notes (Signed)
VOB submitted for SynviscOne, right knee.  

## 2022-05-23 ENCOUNTER — Other Ambulatory Visit: Payer: Self-pay | Admitting: Physician Assistant

## 2022-06-06 DIAGNOSIS — M25572 Pain in left ankle and joints of left foot: Secondary | ICD-10-CM | POA: Diagnosis not present

## 2022-06-09 ENCOUNTER — Telehealth: Payer: Self-pay

## 2022-06-09 NOTE — Telephone Encounter (Signed)
IC LMVM for patient to call me back to discuss.  

## 2022-06-09 NOTE — Telephone Encounter (Signed)
FYI-  Called and left a VM for patient to call back concerning gel injection.    Patient's insurance, Christine Webb does not cover any gel injections. Please advise on next option for patient.

## 2022-06-09 NOTE — Telephone Encounter (Signed)
Can repeat cortisone injection in 3-4 months as needed.  Continue with nonloadbearing exercises like Dr August Saucer discussed with her.  Could consider formal PT if she really wants that but hopefully she is still doing well from cortisone injection recently

## 2022-06-21 IMAGING — US US ABDOMEN LIMITED
1 series · 14 of 25 positions shown · non-contrast
Comparison: 08/30/2020

CLINICAL DATA: RIGHT upper quadrant epigastric pain for 8 months,
burning sensation in abdomen near cholecystectomy incision sites, no
nausea. History hypertension

EXAM:
ULTRASOUND ABDOMEN LIMITED RIGHT UPPER QUADRANT

[Series 1: us abdomen limited ruq (liver/gb) · 14 of 55 slices shown]
[im 1/55]
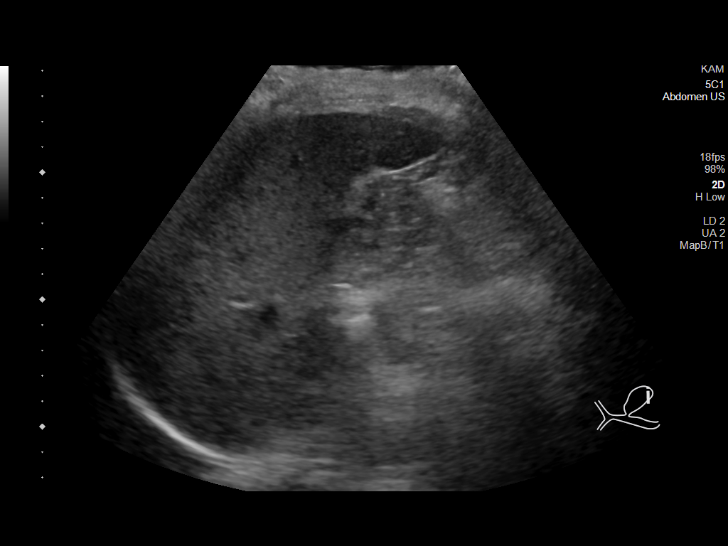
[im 5/55]
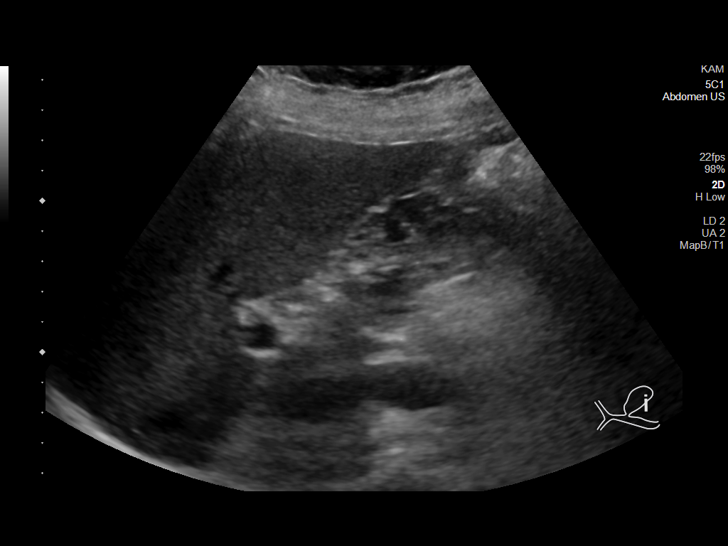
[im 10/55]
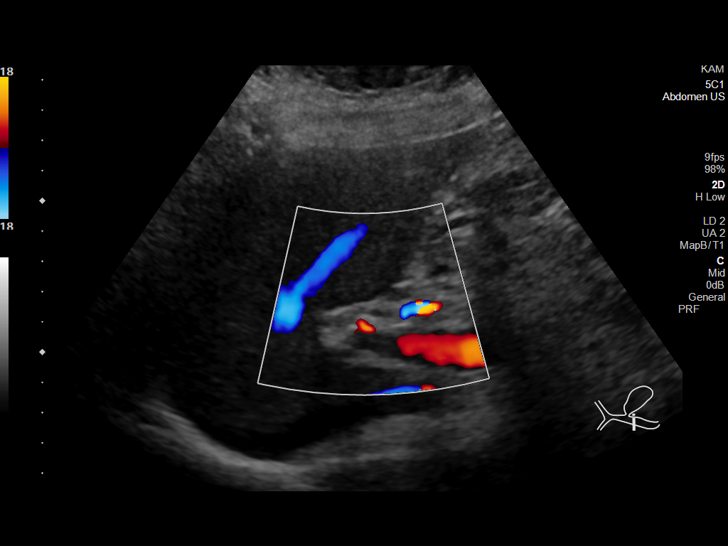
[im 14/55]
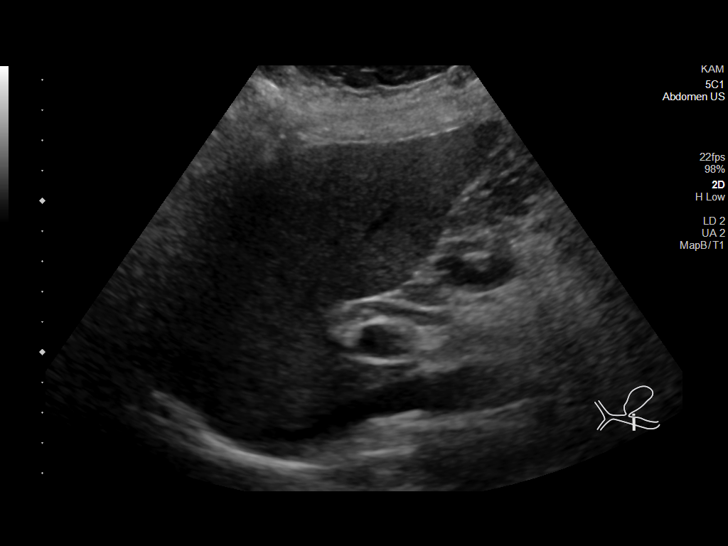
[im 19/55]
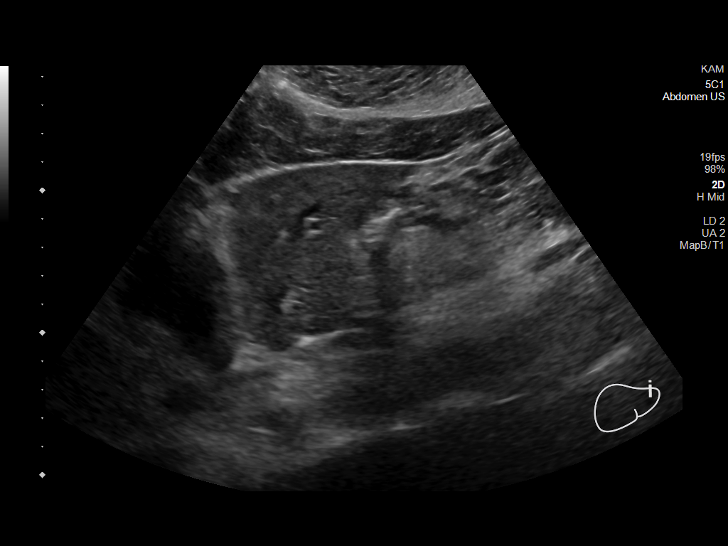
[im 21/55]
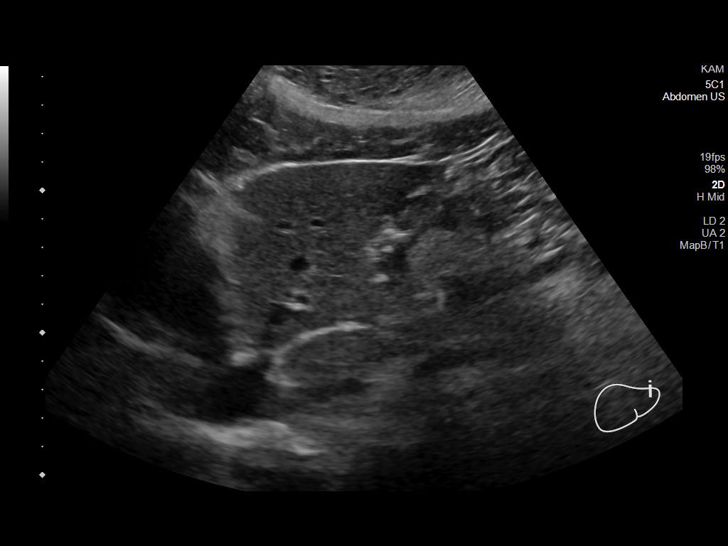
[im 25/55]
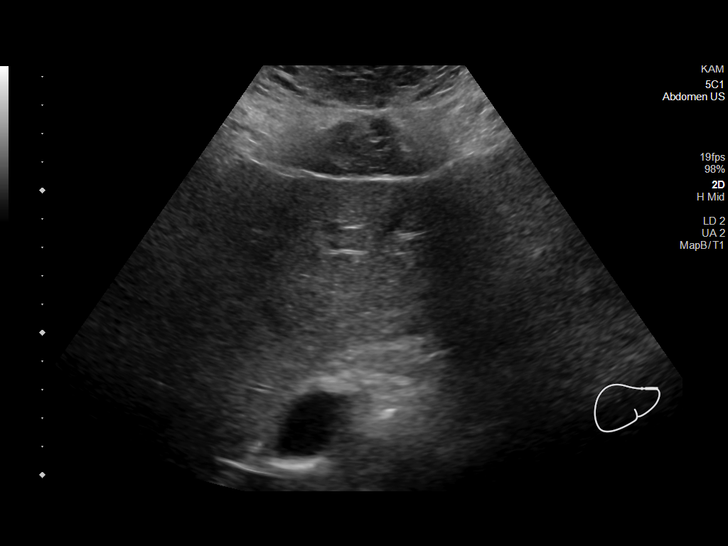
[im 30/55]
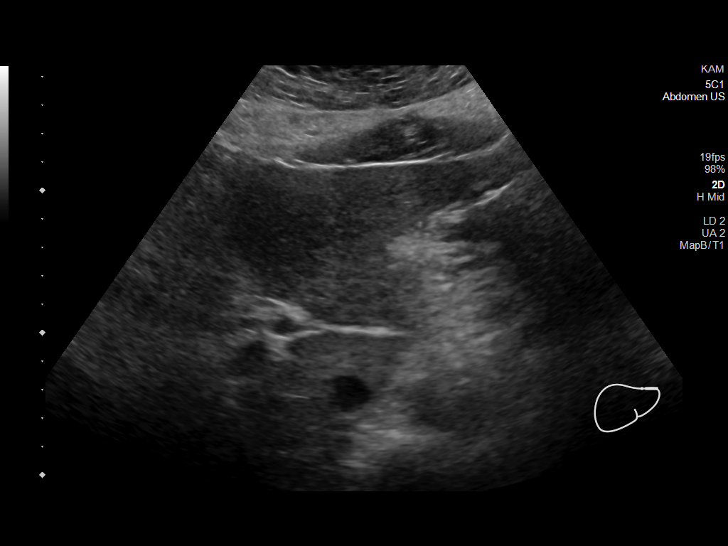
[im 34/55]
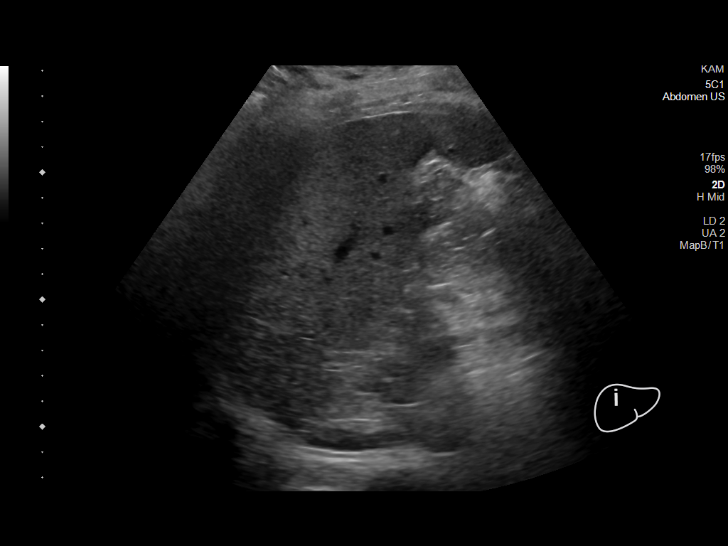
[im 37/55]
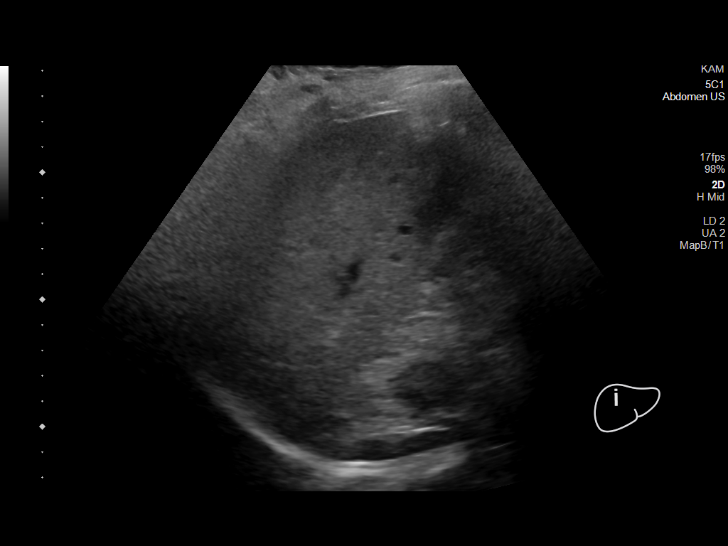
[im 41/55]
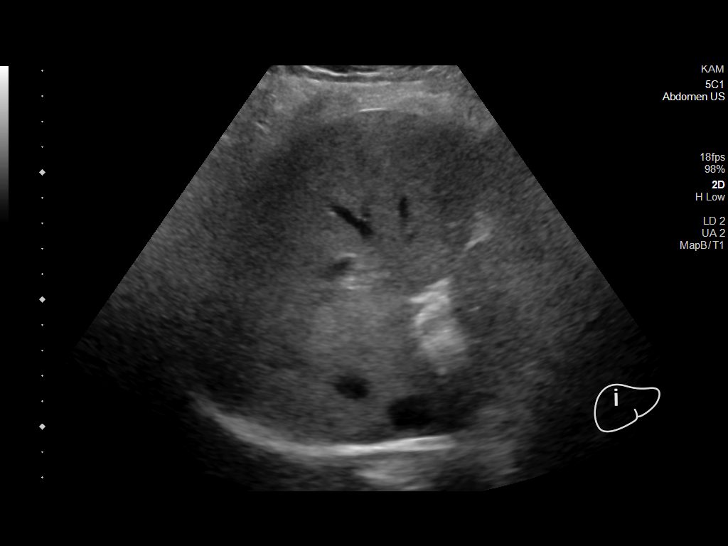
[im 46/55]
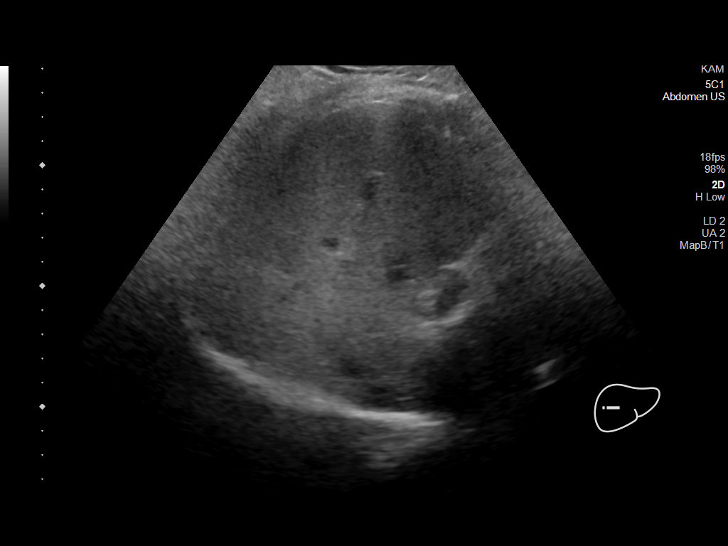
[im 50/55]
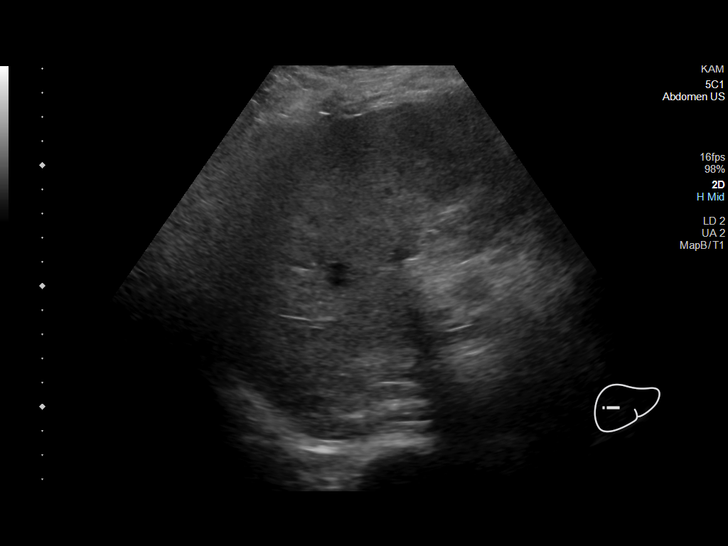
[im 55/55]
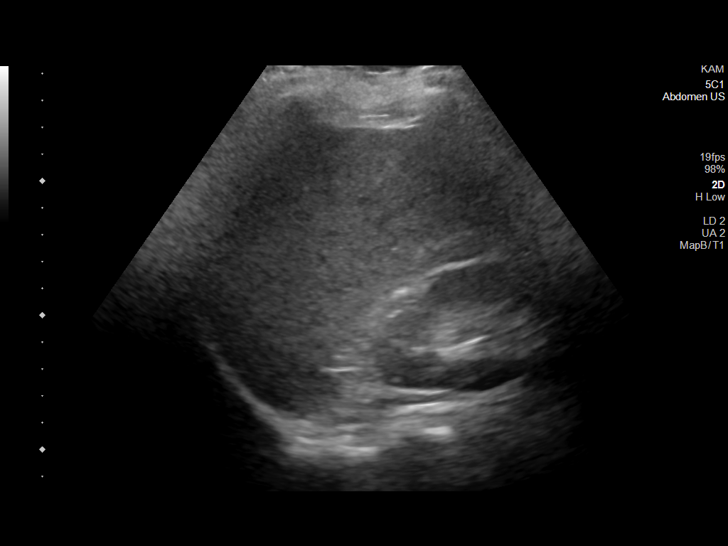

[14 of 25 positions shown; findings below may reference images not displayed]

FINDINGS: Gallbladder:

Post cholecystectomy

Common bile duct:

Diameter: 2 mm, normal

Liver:

Echogenic parenchyma, likely fatty infiltration though this can be
seen with cirrhosis and certain infiltrative disorders. No focal
hepatic mass or nodularity. Portal vein is patent on color Doppler
imaging with normal direction of blood flow towards the liver.

Other: No RIGHT upper quadrant free fluid.
IMPRESSION: Post cholecystectomy with probable fatty infiltration of liver as
above.

No acute abnormalities.

## 2022-06-23 ENCOUNTER — Encounter: Payer: Self-pay | Admitting: *Deleted

## 2022-07-04 DIAGNOSIS — Z01419 Encounter for gynecological examination (general) (routine) without abnormal findings: Secondary | ICD-10-CM | POA: Diagnosis not present

## 2022-07-04 DIAGNOSIS — Z1231 Encounter for screening mammogram for malignant neoplasm of breast: Secondary | ICD-10-CM | POA: Diagnosis not present

## 2022-07-04 DIAGNOSIS — Z6836 Body mass index (BMI) 36.0-36.9, adult: Secondary | ICD-10-CM | POA: Diagnosis not present

## 2022-07-18 DIAGNOSIS — D225 Melanocytic nevi of trunk: Secondary | ICD-10-CM | POA: Diagnosis not present

## 2022-07-18 DIAGNOSIS — L821 Other seborrheic keratosis: Secondary | ICD-10-CM | POA: Diagnosis not present

## 2022-07-18 DIAGNOSIS — L578 Other skin changes due to chronic exposure to nonionizing radiation: Secondary | ICD-10-CM | POA: Diagnosis not present

## 2022-07-18 DIAGNOSIS — L570 Actinic keratosis: Secondary | ICD-10-CM | POA: Diagnosis not present

## 2022-07-18 DIAGNOSIS — L814 Other melanin hyperpigmentation: Secondary | ICD-10-CM | POA: Diagnosis not present

## 2022-09-11 ENCOUNTER — Encounter: Payer: Self-pay | Admitting: *Deleted

## 2022-10-28 ENCOUNTER — Telehealth: Payer: Self-pay | Admitting: Physician Assistant

## 2022-10-28 NOTE — Telephone Encounter (Signed)
Please tell pt, It will be determined at her appt what labs will be ordered at that time. We do not like to order before hand.

## 2022-10-28 NOTE — Telephone Encounter (Signed)
Patient has CPE scheduled on 11/07/22 @ 1:20pm. She requests to have fasting labs done prior to CPE if possible.

## 2022-11-07 ENCOUNTER — Encounter: Payer: Self-pay | Admitting: Physician Assistant

## 2022-11-07 ENCOUNTER — Ambulatory Visit (INDEPENDENT_AMBULATORY_CARE_PROVIDER_SITE_OTHER): Payer: BC Managed Care – PPO | Admitting: Physician Assistant

## 2022-11-07 VITALS — BP 120/80 | HR 83 | Temp 97.3°F | Ht 63.5 in | Wt 216.0 lb

## 2022-11-07 DIAGNOSIS — E785 Hyperlipidemia, unspecified: Secondary | ICD-10-CM

## 2022-11-07 DIAGNOSIS — M5431 Sciatica, right side: Secondary | ICD-10-CM

## 2022-11-07 DIAGNOSIS — Z0000 Encounter for general adult medical examination without abnormal findings: Secondary | ICD-10-CM

## 2022-11-07 DIAGNOSIS — E669 Obesity, unspecified: Secondary | ICD-10-CM

## 2022-11-07 DIAGNOSIS — I10 Essential (primary) hypertension: Secondary | ICD-10-CM | POA: Diagnosis not present

## 2022-11-07 DIAGNOSIS — Z1211 Encounter for screening for malignant neoplasm of colon: Secondary | ICD-10-CM

## 2022-11-07 MED ORDER — ZEPBOUND 2.5 MG/0.5ML ~~LOC~~ SOAJ: 2.5000 mg | SUBCUTANEOUS | 0 refills | Status: DC

## 2022-11-07 MED ORDER — DICLOFENAC SODIUM 75 MG PO TBEC: 75.0000 mg | DELAYED_RELEASE_TABLET | Freq: Two times a day (BID) | ORAL | 0 refills | Status: DC

## 2022-11-07 NOTE — Progress Notes (Signed)
Subjective:    Christine Webb is a 52 y.o. female and is here for a comprehensive physical exam.  HPI  Health Maintenance Due  Topic Date Due   Zoster Vaccines- Shingrix (1 of 2) Never done   COLONOSCOPY (Pts 45-54yr Insurance coverage will need to be confirmed)  Never done    Acute Concerns: No acute concerns discussed today.  Chronic Issues: R-sided Sciatica Patient reports that she has been experiencing increasing sciatica pain for the last 8 months. She states that it is painful standing up for more than 15 minutes. She was referred to a physical therapist, but has not gone yet. Pain is relieved with movement. Patient manages symptoms with ibuprofen.   HLD Patient is compliant with 20 mg lipitor with no concerns.   Hypertension Patient is complaint with 40 mg lisinopril and HCTZ 12.5 mg daily with no concerns.  Health Maintenance: Mammogram -- last completed on 04/12/2021. PAP -- last completed on 04/05/2021. Diet -- She states that she maintains a fair diet most days, but could improve. Exercise -- She states that she goes to the gym 2-3 days a week to walk and use elliptical machine. She also walks at home with her husband.   Mood -- She is in a stable mood this visit.  UTD with dentist? - She is UTD on dental care UTD with eye doctor? - She is UTD on vision care.  Weight history: Wt Readings from Last 10 Encounters:  11/07/22 216 lb (98 kg)  05/01/22 200 lb (90.7 kg)  04/18/22 200 lb (90.7 kg)  12/27/21 204 lb (92.5 kg)  12/17/21 202 lb 9.6 oz (91.9 kg)  11/01/21 198 lb 8 oz (90 kg)  11/30/20 215 lb (97.5 kg)  09/11/20 211 lb (95.7 kg)  08/07/20 211 lb (95.7 kg)   Body mass index is 37.66 kg/m. Patient's last menstrual period was 10/27/2022.  Alcohol use:  reports current alcohol use of about 3.0 standard drinks of alcohol per week.  Tobacco use:  Tobacco Use: Low Risk  (11/07/2022)   Patient History    Smoking Tobacco Use: Never    Smokeless Tobacco  Use: Never    Passive Exposure: Not on file   Eligible for lung cancer screening? no     11/07/2022    1:24 PM  Depression screen PHQ 2/9  Decreased Interest 0  Down, Depressed, Hopeless 0  PHQ - 2 Score 0     Other providers/specialists: Patient Care Team: WInda Coke PUtahas PCP - General (Physician Assistant)    PMHx, SurgHx, SocialHx, Medications, and Allergies were reviewed in the Visit Navigator and updated as appropriate.   Past Medical History:  Diagnosis Date   Allergy    Arthritis    History of chicken pox    History of shingles    Hypertension    Iron deficiency anemia    Ablation; did require transfusions     Past Surgical History:  Procedure Laterality Date   CESAREAN SECTION  2002, 2004   CHOLECYSTECTOMY N/A 12/07/2020   Procedure: LAPAROSCOPIC CHOLECYSTECTOMY WITH INTRAOPERATIVE CHOLANGIOGRAM;  Surgeon: GArmandina Gemma MD;  Location: [facility];  Service: General;  Laterality: N/A;  ROOM 1 STARTING AT 07:30AM FOR 962MIN   KNEE SURGERY  2012   3 surgeries     Family History  Problem Relation Age of Onset   Hypertension Mother    Breast cancer Mother    Colon cancer Mother    Congestive Heart Failure Mother  Osteoarthritis Father    Hypertension Father    Skin cancer Brother    Lung cancer Maternal Grandmother    Kidney cancer Paternal Grandmother     Social History   Tobacco Use   Smoking status: Never   Smokeless tobacco: Never  Vaping Use   Vaping Use: Never used  Substance Use Topics   Alcohol use: Yes    Alcohol/week: 3.0 standard drinks of alcohol    Types: 3 Glasses of wine per week    Comment: weekends only   Drug use: Never    Review of Systems:   Review of Systems  Constitutional:  Negative for chills, fever, malaise/fatigue and weight loss.  HENT:  Negative for hearing loss, sinus pain and sore throat.   Respiratory:  Negative for cough and hemoptysis.   Cardiovascular:  Negative for chest pain, palpitations, leg  swelling and PND.  Gastrointestinal:  Negative for abdominal pain, constipation, diarrhea, heartburn, nausea and vomiting.  Genitourinary:  Negative for dysuria, frequency and urgency.  Musculoskeletal:  Negative for back pain, myalgias and neck pain.  Skin:  Negative for itching and rash.  Endo/Heme/Allergies:  Negative for polydipsia.  Psychiatric/Behavioral:  Negative for depression. The patient is not nervous/anxious.     Objective:   BP 120/80 (BP Location: Left Arm, Patient Position: Sitting, Cuff Size: Large)   Pulse 83   Temp (!) 97.3 F (36.3 C) (Temporal)   Ht 5' 3.5" (1.613 m)   Wt 216 lb (98 kg)   LMP 10/27/2022   SpO2 96%   BMI 37.66 kg/m  Body mass index is 37.66 kg/m.   General Appearance:    Alert, cooperative, no distress, appears stated age  Head:    Normocephalic, without obvious abnormality, atraumatic  Eyes:    PERRL, conjunctiva/corneas clear, EOM's intact, fundi    benign, both eyes  Ears:    Normal TM's and external ear canals, both ears  Nose:   Nares normal, septum midline, mucosa normal, no drainage    or sinus tenderness  Throat:   Lips, mucosa, and tongue normal; teeth and gums normal  Neck:   Supple, symmetrical, trachea midline, no adenopathy;    thyroid:  no enlargement/tenderness/nodules; no carotid   bruit or JVD  Back:     Symmetric, no curvature, ROM normal, no CVA tenderness  Lungs:     Clear to auscultation bilaterally, respirations unlabored  Chest Wall:    No tenderness or deformity   Heart:    Regular rate and rhythm, S1 and S2 normal, no murmur, rub or gallop  Breast Exam:    Deferred   Abdomen:     Soft, non-tender, bowel sounds active all four quadrants,    no masses, no organomegaly  Genitalia:    Deferred   Extremities:   Extremities normal, atraumatic, no cyanosis or edema  Pulses:   2+ and symmetric all extremities  Skin:   Skin color, texture, turgor normal, no rashes or lesions  Lymph nodes:   Cervical, supraclavicular,  and axillary nodes normal  Neurologic:   CNII-XII intact, normal strength, sensation and reflexes    throughout    Assessment/Plan:   Routine physical examination Today patient counseled on age appropriate routine health concerns for screening and prevention, each reviewed and up to date or declined. Immunizations reviewed and up to date or declined. Labs ordered and reviewed. Risk factors for depression reviewed and negative. Hearing function and visual acuity are intact. ADLs screened and addressed as needed. Functional ability  and level of safety reviewed and appropriate. Education, counseling and referrals performed based on assessed risks today. Patient provided with a copy of personalized plan for preventive services.  Primary hypertension Normotensive Continue HCTZ 12.5 mg daily and lisinopril 40 mg daily Follow-up in 1 yr, sooner if concerns  Obesity, unspecified classification, unspecified obesity type, unspecified whether serious comorbidity present Continue healthy lifestyle efforts Will trial Zepbound 0.25 mg weekly -- side effects, risks, benefits discussed  Hyperlipidemia, unspecified hyperlipidemia type Update lipid panel and adjust lipitor 20 mg daily  Special screening for malignant neoplasms, colon Colonoscopy referral placed  R sciatic nerve pain No red flags Trial prn diclofenac Stretches provided Discussed recommendation to proceed with PT   I,Verona Buck,acting as a scribe for [employer], PA.,have documented all relevant documentation on the behalf of Inda Coke, PA,as directed by  Inda Coke, PA while in the presence of Inda Coke, [place].   I, Inda Coke, [place], have reviewed all documentation for this visit. The documentation on 11/07/22 for the exam, diagnosis, procedures, and orders are all accurate and complete.   Inda Coke, PA-C [place]

## 2022-11-07 NOTE — Patient Instructions (Addendum)
It was great to see you!  We will send in diclofenac 75 mg twice daily for you to take while your sciatic pain flares Do not take additional ibuprofen when you take this Trial stretches  We will send in Zepbound for you This may take a few weeks to process paperwork  Colonoscopy referral placed - they should call you  Please reach out to your insurance re: shingrix -- you can get here or at pharmacy, but insurance will tell you where they prefer  Please make an appointment with the lab on your way out. I would like for you to return for lab work within 1-2 weeks. After midnight on the day of the lab draw, please do not eat anything. You may have water, black coffee, unsweetened tea.  Take care,  Aldona Bar

## 2022-11-09 ENCOUNTER — Other Ambulatory Visit: Payer: Self-pay | Admitting: Physician Assistant

## 2022-11-10 ENCOUNTER — Telehealth: Payer: Self-pay | Admitting: *Deleted

## 2022-11-10 NOTE — Telephone Encounter (Signed)
PA needed for Zepbound 2.5 mg. PA done thru Covermymeds. Awaiting determination. KEY: [id number]

## 2022-11-11 NOTE — Telephone Encounter (Signed)
Called [facility] pharmacy and spoke to [place], told her PA for Zepbound 2.5 mg has been approved, effective 10/11/2022 to 07/08/2023. Kim verbalized understanding.

## 2022-11-11 NOTE — Telephone Encounter (Signed)
Pt called back told her PA for Zepbound was approved and pharmacy was notified. Pt verbalized understanding.

## 2022-11-11 NOTE — Telephone Encounter (Signed)
Left message on voicemail to call office.  

## 2022-11-11 NOTE — Telephone Encounter (Signed)
Received response: Approved on February 12 CaseId:[id number];Status:Approved;Review Type:Prior Auth;Coverage Start Date:10/11/2022;Coverage End Date:07/08/2023;

## 2022-11-14 ENCOUNTER — Other Ambulatory Visit: Payer: BC Managed Care – PPO

## 2022-11-18 ENCOUNTER — Other Ambulatory Visit: Payer: BC Managed Care – PPO

## 2022-11-19 ENCOUNTER — Other Ambulatory Visit: Payer: Self-pay | Admitting: Physician Assistant

## 2022-11-21 ENCOUNTER — Encounter: Payer: Self-pay | Admitting: Physical Therapy

## 2022-11-21 ENCOUNTER — Ambulatory Visit (INDEPENDENT_AMBULATORY_CARE_PROVIDER_SITE_OTHER): Payer: BC Managed Care – PPO | Admitting: Physical Therapy

## 2022-11-21 ENCOUNTER — Other Ambulatory Visit (INDEPENDENT_AMBULATORY_CARE_PROVIDER_SITE_OTHER): Payer: BC Managed Care – PPO

## 2022-11-21 DIAGNOSIS — I10 Essential (primary) hypertension: Secondary | ICD-10-CM | POA: Diagnosis not present

## 2022-11-21 DIAGNOSIS — M5459 Other low back pain: Secondary | ICD-10-CM | POA: Diagnosis not present

## 2022-11-21 DIAGNOSIS — E669 Obesity, unspecified: Secondary | ICD-10-CM

## 2022-11-21 DIAGNOSIS — G8929 Other chronic pain: Secondary | ICD-10-CM | POA: Diagnosis not present

## 2022-11-21 DIAGNOSIS — M25572 Pain in left ankle and joints of left foot: Secondary | ICD-10-CM | POA: Diagnosis not present

## 2022-11-21 DIAGNOSIS — M25561 Pain in right knee: Secondary | ICD-10-CM | POA: Diagnosis not present

## 2022-11-21 LAB — CBC WITH DIFFERENTIAL/PLATELET
Basophils Absolute: 0 10*3/uL (ref 0.0–0.1)
Basophils Relative: 0.5 % (ref 0.0–3.0)
Eosinophils Absolute: 0.2 10*3/uL (ref 0.0–0.7)
Eosinophils Relative: 2.6 % (ref 0.0–5.0)
HCT: 42.5 % (ref 36.0–46.0)
Hemoglobin: 14.5 g/dL (ref 12.0–15.0)
Lymphocytes Relative: 28 % (ref 12.0–46.0)
Lymphs Abs: 2 10*3/uL (ref 0.7–4.0)
MCHC: 34.1 g/dL (ref 30.0–36.0)
MCV: 90.5 fl (ref 78.0–100.0)
Monocytes Absolute: 0.4 10*3/uL (ref 0.1–1.0)
Monocytes Relative: 5.7 % (ref 3.0–12.0)
Neutro Abs: 4.5 10*3/uL (ref 1.4–7.7)
Neutrophils Relative %: 63.2 % (ref 43.0–77.0)
Platelets: 262 10*3/uL (ref 150.0–400.0)
RBC: 4.69 Mil/uL (ref 3.87–5.11)
RDW: 13 % (ref 11.5–15.5)
WBC: 7 10*3/uL (ref 4.0–10.5)

## 2022-11-21 LAB — COMPREHENSIVE METABOLIC PANEL
ALT: 26 U/L (ref 0–35)
AST: 21 U/L (ref 0–37)
Albumin: 4.2 g/dL (ref 3.5–5.2)
Alkaline Phosphatase: 53 U/L (ref 39–117)
BUN: 15 mg/dL (ref 6–23)
CO2: 24 mEq/L (ref 19–32)
Calcium: 9.1 mg/dL (ref 8.4–10.5)
Chloride: 104 mEq/L (ref 96–112)
Creatinine, Ser: 0.8 mg/dL (ref 0.40–1.20)
GFR: 84.94 mL/min (ref >60.00)
Glucose, Bld: 81 mg/dL (ref 70–99)
Potassium: 3.9 mEq/L (ref 3.5–5.1)
Sodium: 137 mEq/L (ref 135–145)
Total Bilirubin: 1 mg/dL (ref 0.2–1.2)
Total Protein: 6.5 g/dL (ref 6.0–8.3)

## 2022-11-21 LAB — LIPID PANEL
Cholesterol: 167 mg/dL (ref 0–200)
HDL: 55.9 mg/dL (ref >39.00)
LDL Cholesterol: 86 mg/dL (ref 0–99)
NonHDL: 111.47
Total CHOL/HDL Ratio: 3
Triglycerides: 129 mg/dL (ref 0.0–149.0)
VLDL: 25.8 mg/dL (ref 0.0–40.0)

## 2022-11-21 NOTE — Therapy (Unsigned)
OUTPATIENT PHYSICAL THERAPY THORACOLUMBAR EVALUATION   Patient Name: Christine Webb MRN: [id number] DOB:12/08/1970, 52 y.o., female Today's Date: 11/21/2022  END OF SESSION:  PT End of Session - 11/21/22 1042     Visit Number 1    Number of Visits 16    Date for PT Re-Evaluation 01/16/23    Authorization Type BCBS    PT Start Time [phone]    PT Stop Time 1015    PT Time Calculation (min) 41 min    Activity Tolerance Patient tolerated treatment well    Behavior During Therapy [place] for tasks assessed/performed             Past Medical History:  Diagnosis Date   Allergy    Arthritis    History of chicken pox    History of shingles    Hypertension    Iron deficiency anemia    Ablation; did require transfusions   Past Surgical History:  Procedure Laterality Date   CESAREAN SECTION  2002, 2004   CHOLECYSTECTOMY N/A 12/07/2020   Procedure: LAPAROSCOPIC CHOLECYSTECTOMY WITH INTRAOPERATIVE CHOLANGIOGRAM;  Surgeon: Armandina Gemma, MD;  Location: [facility];  Service: General;  Laterality: N/A;  ROOM 1 STARTING AT 07:30AM FOR 90 MIN   KNEE SURGERY  2012   3 surgeries   Patient Active Problem List   Diagnosis Date Noted   Chronic vertigo 11/01/2021   Hypertension     PCP: Inda Coke  REFERRING PROVIDER: Marylynn Pearson  REFERRING DIAG: Sciatica, right side  Rationale for Evaluation and Treatment: Rehabilitation  THERAPY DIAG:  Other low back pain  Chronic pain of right knee  Pain in left ankle and joints of left foot  ONSET DATE: 3 years ago  SUBJECTIVE:                                                                                                                                                                                           SUBJECTIVE STATEMENT: Pt reports pain for about 3 years, Pain R low back, all the way down R LE. Started to get much worse in October. Standing is main problem and what brings on leg pain. Can only stand for a few minutes then pain  stairs. States better with walking, movement, sitting. Can walk up to 2 mi/daily with hills and no pain in back.  Previous R knee pain - Skiing accident with  fx in tibia, acl repair and knee scope More recently- medial pain with walking hills, had shot, has been ok. Labor day- sprained both ankles, L more severe, mild soreness at lateral ankle on L, feel stiff in AM.   Exercise:  Walks , 2 mi/day, not doing strengthening at this time due to pain   Work: full time, sits at desk, has standing option.    Previous knee pain-  MRI from August 2023:   PERTINENT HISTORY: none   PAIN:  Are you having pain? Yes: NPRS scale: up to 8/10 Pain location: R SI, down R LE/posteriorly  Pain description: Sore Aggravating factors: standing  Relieving factors: movement  PRECAUTIONS: None  WEIGHT BEARING RESTRICTIONS: No  FALLS:  Has patient fallen in last 6 months? Yes. Number of falls 1 Night, friends house, missed steps.   PLOF: Independent  PATIENT GOALS: Decreased pain with standing    OBJECTIVE:   DIAGNOSTIC FINDINGS:    PATIENT SURVEYS:  Foto-   COGNITION: Overall cognitive status: Within functional limits for tasks assessed     SENSATION: WFL  POSTURE:  Standing: R knee in mild flexion,  feet: mostly neutral foot alignment, Hips appear even.   PALPATION: Supine: L LE shorter, R longer, long sit test negative.  Standing: R knee in slight flexion , back looks even.  Ankle: mild soreness at L lateral ankle Knee: no pain to palpate, did have pain at patella tendon at medial patella.  Back: pain at R SI. And surrounding glue muscle  LUMBAR ROM:   AROM eval  Flexion wfl  Extension wfl  Right lateral flexion   Left lateral flexion   Right rotation   Left rotation    (Blank rows = not tested)  LOWER EXTREMITY ROM:     Hips: WFL,  Knees: WFL  LOWER EXTREMITY MMT:    MMT Right eval Left eval  Hip flexion 4+ 4+  Hip extension    Hip abduction 4+ 4+  Hip  adduction    Hip internal rotation    Hip external rotation    Knee flexion 5 5  Knee extension 5 5  Ankle dorsiflexion    Ankle plantarflexion    Ankle inversion    Ankle eversion     (Blank rows = not tested)  LUMBAR SPECIAL TESTS:  Long sit test Neg, Painful SI. Painful stork on R.    TODAY'S TREATMENT:                                                                                                                              DATE:   11/21/2022 Therapeutic Exercise: Aerobic: Supine: Seated: Standing: Stretches: Seated and supine fig 4 piriformis x 2 ea bil;  Reviewed childs pose for HEP Neuromuscular Re-education: Manual Therapy: DTM R SI and glute min, med, long leg distraction on R.  Self Care:    PATIENT EDUCATION:  Education details: PT POC, Exam findings, HEP Person educated: Patient Education method: Explanation, Demonstration, Tactile cues, Verbal cues, and Handouts Education comprehension: verbalized understanding, returned demonstration, verbal cues required, tactile cues required, and needs further education  HOME EXERCISE PROGRAM: Reviewed  ASSESSMENT:  CLINICAL IMPRESSION: Patient presents with primary complaint of  pain in R low back. Pts pain consistent with SI pain, and has most pain with standing. She also has previous problems with R knee, now with mild pain, and knee staying in slight flexion in stance. Pt with decreased ability for full functional activities due to pain and deficit, and will benefit from skilled PT to improve.   OBJECTIVE IMPAIRMENTS: Abnormal gait, decreased activity tolerance, decreased mobility, decreased ROM, decreased strength, increased muscle spasms, improper body mechanics, and pain.   ACTIVITY LIMITATIONS: bending, standing, squatting, stairs, and locomotion level  PARTICIPATION LIMITATIONS: cleaning, laundry, shopping, community activity, and occupation  PERSONAL FACTORS: Time since onset of injury/illness/exacerbation  are also affecting patient's functional outcome.   REHAB POTENTIAL: Good  CLINICAL DECISION MAKING: Stable/uncomplicated  EVALUATION COMPLEXITY: Low   GOALS: Goals reviewed with patient? Yes  SHORT TERM GOALS: Target date: 12/05/2022    Pt to be independent with initial HEP  Goal status: INITIAL  2.  Pt to demo decreased muscle tension in R glute by at least 50 %  Goal status: INITIAL     LONG TERM GOALS: Target date: 01/16/2023    Pt to be independent with final HEP for ankle, knee and back pain  Goal status: INITIAL  2.  Pt to report decreased pain at R SI and in R LE to 0-2/10 to improve ability for functional and community activity   Goal status: INITIAL  3.  Pt to tolerate at least 15 min of standing without pain greater than 2/10   Goal status: INITIAL    PLAN:  PT FREQUENCY: 1-2x/week  PT DURATION: 8 weeks  PLANNED INTERVENTIONS: Therapeutic exercises, Therapeutic activity, Neuromuscular re-education, Balance training, Gait training, Patient/Family education, Self Care, Joint mobilization, Joint manipulation, Stair training, Orthotic/Fit training, DME instructions, Dry Needling, Electrical stimulation, Spinal manipulation, Spinal mobilization, Cryotherapy, Moist heat, Taping, Traction, Ultrasound, Ionotophoresis '4mg'$ /ml Dexamethasone, and Manual therapy.  PLAN FOR NEXT SESSION: manual for R glute/SI, possible dry needling,    Lyndee Hensen, PT, DPT 10:55 AM  11/21/22

## 2022-11-28 ENCOUNTER — Ambulatory Visit (INDEPENDENT_AMBULATORY_CARE_PROVIDER_SITE_OTHER): Payer: BC Managed Care – PPO | Admitting: Physical Therapy

## 2022-11-28 ENCOUNTER — Encounter: Payer: Self-pay | Admitting: Physical Therapy

## 2022-11-28 DIAGNOSIS — M25561 Pain in right knee: Secondary | ICD-10-CM

## 2022-11-28 DIAGNOSIS — M25572 Pain in left ankle and joints of left foot: Secondary | ICD-10-CM

## 2022-11-28 DIAGNOSIS — G8929 Other chronic pain: Secondary | ICD-10-CM | POA: Diagnosis not present

## 2022-11-28 DIAGNOSIS — M5459 Other low back pain: Secondary | ICD-10-CM | POA: Diagnosis not present

## 2022-11-28 NOTE — Therapy (Signed)
OUTPATIENT PHYSICAL THERAPY THORACOLUMBAR TREATMENT    Patient Name: Christine Webb MRN: [id number] DOB:Mar 02, 1971, 52 y.o., female Today's Date: 11/28/2022  END OF SESSION:  PT End of Session - 11/28/22 0845     Visit Number 2    Number of Visits 16    Date for PT Re-Evaluation 01/16/23    Authorization Type [employer]    PT Start Time [phone]    PT Stop Time 0930    PT Time Calculation (min) 43 min    Activity Tolerance Patient tolerated treatment well    Behavior During Therapy [facility] for tasks assessed/performed             Past Medical History:  Diagnosis Date   Allergy    Arthritis    History of chicken pox    History of shingles    Hypertension    Iron deficiency anemia    Ablation; did require transfusions   Past Surgical History:  Procedure Laterality Date   CESAREAN SECTION  2002, 2004   CHOLECYSTECTOMY N/A 12/07/2020   Procedure: LAPAROSCOPIC CHOLECYSTECTOMY WITH INTRAOPERATIVE CHOLANGIOGRAM;  Surgeon: Armandina Gemma, MD;  Location: [facility];  Service: General;  Laterality: N/A;  ROOM 1 STARTING AT 07:30AM FOR 90 MIN   KNEE SURGERY  2012   3 surgeries   Patient Active Problem List   Diagnosis Date Noted   Chronic vertigo 11/01/2021   Hypertension     PCP: Inda Coke  REFERRING PROVIDER: Marylynn Pearson  REFERRING DIAG: Sciatica, right side  Rationale for Evaluation and Treatment: Rehabilitation  THERAPY DIAG:  Other low back pain  Chronic pain of right knee  Pain in left ankle and joints of left foot  ONSET DATE: 3 years ago  SUBJECTIVE:                                                                                                                                                                                           SUBJECTIVE STATEMENT: 11/28/2022 pt states continued pain, mostly in R glute. Quite sore in last few days.   Eval: Pt reports pain for about 3 years, Pain R low back, all the way down R LE. Started to get much worse in October.  Standing is main problem and what brings on leg pain. Can only stand for a few minutes then pain stairs. States better with walking, movement, sitting. Can walk up to 2 mi/daily with hills and no pain in back.  Previous R knee pain - Skiing accident with  fx in tibia, acl repair and knee scope More recently- medial pain with walking hills, had shot, has been ok. Labor day- sprained  both ankles, L more severe, mild soreness at lateral ankle on L, feel stiff in AM.   Exercise: Walks , 2 mi/day, not doing strengthening at this time due to pain   Work: full time, sits at desk, has standing option.    Previous knee pain-  MRI from August 2023:   PERTINENT HISTORY: none   PAIN:  Are you having pain? Yes: NPRS scale: up to 8/10 Pain location: R SI, down R LE/posteriorly  Pain description: Sore Aggravating factors: standing  Relieving factors: movement  PRECAUTIONS: None  WEIGHT BEARING RESTRICTIONS: No  FALLS:  Has patient fallen in last 6 months? Yes. Number of falls 1 Night, friends house, missed steps.   PLOF: Independent  PATIENT GOALS: Decreased pain with standing    OBJECTIVE:   DIAGNOSTIC FINDINGS:    PATIENT SURVEYS:  Foto-   COGNITION: Overall cognitive status: Within functional limits for tasks assessed     SENSATION: WFL  POSTURE:  Standing: R knee in mild flexion,  feet: mostly neutral foot alignment, Hips appear even.   PALPATION: Supine: L LE shorter, R longer, long sit test negative.  Standing: R knee in slight flexion , back looks even.  Ankle: mild soreness at L lateral ankle Knee: no pain to palpate, did have pain at patella tendon at medial patella.  Back: pain at R SI. And surrounding glue muscle  LUMBAR ROM:   AROM eval  Flexion wfl  Extension wfl  Right lateral flexion   Left lateral flexion   Right rotation   Left rotation    (Blank rows = not tested)  LOWER EXTREMITY ROM:     Hips: WFL,  Knees: WFL  LOWER EXTREMITY MMT:    MMT  Right eval Left eval  Hip flexion 4+ 4+  Hip extension    Hip abduction 4+ 4+  Hip adduction    Hip internal rotation    Hip external rotation    Knee flexion 5 5  Knee extension 5 5  Ankle dorsiflexion    Ankle plantarflexion    Ankle inversion    Ankle eversion     (Blank rows = not tested)  LUMBAR SPECIAL TESTS:  Long sit test Neg, Painful SI. Painful stork on R.    TODAY'S TREATMENT:                                                                                                                              DATE:   11/28/2022 Therapeutic Exercise: Aerobic: Supine: S/L:  Hip abd 2 x 10 on R;  Clams 2 x 10 on R;  Seated: Standing: Stretches: supine fig 4 piriformis x 2 ea bil;  Hip IR across body x 3 on R; LTR x 15;  Neuromuscular Re-education: Manual Therapy: DTM R  glute min, med, piriformis,  long leg distraction on R. MET with R hip flexion;  Self Care: Modalities:  Moist heat pack at end of session to R  glute, in S/L. X 10 min  Trigger Point Dry-Needling  Treatment instructions: Expect mild to moderate muscle soreness. S/S of pneumothorax if dry needled over a lung field, and to seek immediate medical attention should they occur. Patient verbalized understanding of these instructions and education.  Patient Consent Given: Yes Education handout provided: Yes Muscles treated: Glue med, med Electrical stimulation performed: No Parameters: N/A Treatment response/outcome: twitch response, palpable increase in muscle length.      PATIENT EDUCATION:  Education details: updated and reviewed HEP Person educated: Patient Education method: Explanation, Demonstration, Tactile cues, Verbal cues, and Handouts Education comprehension: verbalized understanding, returned demonstration, verbal cues required, tactile cues required, and needs further education  HOME EXERCISE PROGRAM: Access Code: [email or web address] URL: [email or web address] Date: 11/28/2022 Prepared  by: Lyndee Hensen  Exercises - Sidelying Hip Abduction  - 1 x daily - 2 sets - 10 reps - Clamshell  - 1 x daily - 2 sets - 10 reps - Supine Lower Trunk Rotation  - 2 x daily - 10 reps - 5 hold - Supine Piriformis Stretch Pulling Heel to Hip  - 2 x daily - 3 reps - 30 hold - Supine Piriformis Stretch with Leg Straight  - 2 x daily - 3 reps - 30 hold - Seated Figure 4 Piriformis Stretch  - 2 x daily - 2 sets - 3 reps - 30 hold  ASSESSMENT:  CLINICAL IMPRESSION: 11/28/2022 Pt with much tenderness in R glute. Less pain actual SIJ today. Focus for manual and tissue release today. Pt with good tolerance for dry needling, will likely benefit from continued manual in next few sessions if she has positive response. Able to perform light strength without increased pain.   Eval: Patient presents with primary complaint of pain in R low back. Pts pain consistent with SI pain, and has most pain with standing. She also has previous problems with R knee, now with mild pain, and knee staying in slight flexion in stance. Pt with decreased ability for full functional activities due to pain and deficit, and will benefit from skilled PT to improve.   OBJECTIVE IMPAIRMENTS: Abnormal gait, decreased activity tolerance, decreased mobility, decreased ROM, decreased strength, increased muscle spasms, improper body mechanics, and pain.   ACTIVITY LIMITATIONS: bending, standing, squatting, stairs, and locomotion level  PARTICIPATION LIMITATIONS: cleaning, laundry, shopping, community activity, and occupation  PERSONAL FACTORS: Time since onset of injury/illness/exacerbation are also affecting patient's functional outcome.   REHAB POTENTIAL: Good  CLINICAL DECISION MAKING: Stable/uncomplicated  EVALUATION COMPLEXITY: Low   GOALS: Goals reviewed with patient? Yes  SHORT TERM GOALS: Target date: 12/05/2022    Pt to be independent with initial HEP  Goal status: INITIAL  2.  Pt to demo decreased muscle tension  in R glute by at least 50 %  Goal status: INITIAL     LONG TERM GOALS: Target date: 01/16/2023    Pt to be independent with final HEP for ankle, knee and back pain  Goal status: INITIAL  2.  Pt to report decreased pain at R SI and in R LE to 0-2/10 to improve ability for functional and community activity   Goal status: INITIAL  3.  Pt to tolerate at least 15 min of standing without pain greater than 2/10   Goal status: INITIAL    PLAN:  PT FREQUENCY: 1-2x/week  PT DURATION: 8 weeks  PLANNED INTERVENTIONS: Therapeutic exercises, Therapeutic activity, Neuromuscular re-education, Balance training, Gait training, Patient/Family education, Self Care, Joint mobilization, Joint manipulation, Stair training,  Orthotic/Fit training, DME instructions, Dry Needling, Electrical stimulation, Spinal manipulation, Spinal mobilization, Cryotherapy, Moist heat, Taping, Traction, Ultrasound, Ionotophoresis '4mg'$ /ml Dexamethasone, and Manual therapy.  PLAN FOR NEXT SESSION: manual for R glute/SI, possible dry needling,    Lyndee Hensen, PT, DPT 1:34 PM  11/28/22

## 2022-12-05 ENCOUNTER — Ambulatory Visit (INDEPENDENT_AMBULATORY_CARE_PROVIDER_SITE_OTHER): Payer: BC Managed Care – PPO | Admitting: Physical Therapy

## 2022-12-05 ENCOUNTER — Other Ambulatory Visit: Payer: Self-pay | Admitting: *Deleted

## 2022-12-05 ENCOUNTER — Other Ambulatory Visit: Payer: Self-pay | Admitting: Physician Assistant

## 2022-12-05 DIAGNOSIS — M5459 Other low back pain: Secondary | ICD-10-CM

## 2022-12-05 DIAGNOSIS — G8929 Other chronic pain: Secondary | ICD-10-CM

## 2022-12-05 DIAGNOSIS — M25572 Pain in left ankle and joints of left foot: Secondary | ICD-10-CM

## 2022-12-05 DIAGNOSIS — M25561 Pain in right knee: Secondary | ICD-10-CM

## 2022-12-05 MED ORDER — ZEPBOUND 5 MG/0.5ML ~~LOC~~ SOAJ: 5.0000 mg | SUBCUTANEOUS | 0 refills | Status: DC

## 2022-12-05 NOTE — Telephone Encounter (Signed)
Pt called to see why med refill was canceled. States [facility] in [place] doesn't have any orders for Zepbound. Requests this be sent in.

## 2022-12-05 NOTE — Telephone Encounter (Signed)
Spoke to pt told her I thought I sent you a My chart message, but I can not find it. I will send in next dose of [facility] 5 mg to [facility]. Pt verbalized understanding.

## 2022-12-05 NOTE — Therapy (Signed)
OUTPATIENT PHYSICAL THERAPY THORACOLUMBAR TREATMENT    Patient Name: Christine Webb MRN: [id number] DOB:02/22/1971, 52 y.o., female Today's Date: 12/05/2022  END OF SESSION:    Past Medical History:  Diagnosis Date   Allergy    Arthritis    History of chicken pox    History of shingles    Hypertension    Iron deficiency anemia    Ablation; did require transfusions   Past Surgical History:  Procedure Laterality Date   CESAREAN SECTION  2002, 2004   CHOLECYSTECTOMY N/A 12/07/2020   Procedure: LAPAROSCOPIC CHOLECYSTECTOMY WITH INTRAOPERATIVE CHOLANGIOGRAM;  Surgeon: Armandina Gemma, MD;  Location: [facility];  Service: General;  Laterality: N/A;  ROOM 1 STARTING AT 07:30AM FOR 90 MIN   KNEE SURGERY  2012   3 surgeries   Patient Active Problem List   Diagnosis Date Noted   Chronic vertigo 11/01/2021   Hypertension     PCP: Inda Coke  REFERRING PROVIDER: Marylynn Pearson  REFERRING DIAG: Sciatica, right side  Rationale for Evaluation and Treatment: Rehabilitation  THERAPY DIAG:  No diagnosis found.  ONSET DATE: 3 years ago  SUBJECTIVE:                                                                                                                                                                                           SUBJECTIVE STATEMENT: 12/05/2022 pt states continued pain, mostly in R glute. Quite sore in last few days.   Eval: Pt reports pain for about 3 years, Pain R low back, all the way down R LE. Started to get much worse in October. Standing is main problem and what brings on leg pain. Can only stand for a few minutes then pain stairs. States better with walking, movement, sitting. Can walk up to 2 mi/daily with hills and no pain in back.  Previous R knee pain - Skiing accident with  fx in tibia, acl repair and knee scope More recently- medial pain with walking hills, had shot, has been ok. Labor day- sprained both ankles, L more severe, mild soreness at lateral  ankle on L, feel stiff in AM.   Exercise: Walks , 2 mi/day, not doing strengthening at this time due to pain   Work: full time, sits at desk, has standing option.    Previous knee pain-  MRI from August 2023:   PERTINENT HISTORY: none   PAIN:  Are you having pain? Yes: NPRS scale: up to 8/10 Pain location: R SI, down R LE/posteriorly  Pain description: Sore Aggravating factors: standing  Relieving factors: movement  PRECAUTIONS: None  WEIGHT BEARING RESTRICTIONS: No  FALLS:  Has patient  fallen in last 6 months? Yes. Number of falls 1 Night, friends house, missed steps.   PLOF: Independent  PATIENT GOALS: Decreased pain with standing    OBJECTIVE:   DIAGNOSTIC FINDINGS:    PATIENT SURVEYS:  Foto-   COGNITION: Overall cognitive status: Within functional limits for tasks assessed     SENSATION: WFL  POSTURE:  Standing: R knee in mild flexion,  feet: mostly neutral foot alignment, Hips appear even.   PALPATION: Supine: L LE shorter, R longer, long sit test negative.  Standing: R knee in slight flexion , back looks even.  Ankle: mild soreness at L lateral ankle Knee: no pain to palpate, did have pain at patella tendon at medial patella.  Back: pain at R SI. And surrounding glue muscle  LUMBAR ROM:   AROM eval  Flexion wfl  Extension wfl  Right lateral flexion   Left lateral flexion   Right rotation   Left rotation    (Blank rows = not tested)  LOWER EXTREMITY ROM:     Hips: WFL,  Knees: WFL  LOWER EXTREMITY MMT:    MMT Right eval Left eval  Hip flexion 4+ 4+  Hip extension    Hip abduction 4+ 4+  Hip adduction    Hip internal rotation    Hip external rotation    Knee flexion 5 5  Knee extension 5 5  Ankle dorsiflexion    Ankle plantarflexion    Ankle inversion    Ankle eversion     (Blank rows = not tested)  LUMBAR SPECIAL TESTS:  Long sit test Neg, Painful SI. Painful stork on R.    TODAY'S TREATMENT:                                                                                                                               DATE:   12/05/2022 Therapeutic Exercise: Aerobic: Supine:  Bridging 2 x 10;  S/L:   Hip abd 2 x 10 on R;   Seated: Standing:  Hip abd and ext 2 x 10 ea bil;  Stretches: supine fig 4 piriformis x 2 ea bil;  Hip IR across body x 3 on R;   LTR x 15;  Neuromuscular Re-education: Manual Therapy: DTM R  glute min, med, piriformis,  long leg distraction on R. MET with R hip flexion;  Self Care: Modalities:  Moist heat pack at end of session to R glute, in S/L. X 10 min  Trigger Point Dry-Needling  Treatment instructions: Expect mild to moderate muscle soreness. S/S of pneumothorax if dry needled over a lung field, and to seek immediate medical attention should they occur. Patient verbalized understanding of these instructions and education.  Patient Consent Given: Yes Education handout provided: Yes Muscles treated: Glue med, med Electrical stimulation performed: No Parameters: N/A Treatment response/outcome: twitch response, palpable increase in muscle length.      PATIENT EDUCATION:  Education details: updated and reviewed HEP Person  educated: Patient Education method: Explanation, Demonstration, Tactile cues, Verbal cues, and Handouts Education comprehension: verbalized understanding, returned demonstration, verbal cues required, tactile cues required, and needs further education  HOME EXERCISE PROGRAM: Access Code: [email or web address] URL: [email or web address] Date: 11/28/2022 Prepared by: Lyndee Hensen  Exercises - Sidelying Hip Abduction  - 1 x daily - 2 sets - 10 reps - Clamshell  - 1 x daily - 2 sets - 10 reps - Supine Lower Trunk Rotation  - 2 x daily - 10 reps - 5 hold - Supine Piriformis Stretch Pulling Heel to Hip  - 2 x daily - 3 reps - 30 hold - Supine Piriformis Stretch with Leg Straight  - 2 x daily - 3 reps - 30 hold - Seated Figure 4 Piriformis Stretch  - 2 x  daily - 2 sets - 3 reps - 30 hold  ASSESSMENT:  CLINICAL IMPRESSION: 12/05/2022 Pt with much tenderness in R glute. Less pain actual SIJ today. Focus for manual and tissue release today. Pt with good tolerance for dry needling, will likely benefit from continued manual in next few sessions if she has positive response. Able to perform light strength without increased pain.   Eval: Patient presents with primary complaint of pain in R low back. Pts pain consistent with SI pain, and has most pain with standing. She also has previous problems with R knee, now with mild pain, and knee staying in slight flexion in stance. Pt with decreased ability for full functional activities due to pain and deficit, and will benefit from skilled PT to improve.   OBJECTIVE IMPAIRMENTS: Abnormal gait, decreased activity tolerance, decreased mobility, decreased ROM, decreased strength, increased muscle spasms, improper body mechanics, and pain.   ACTIVITY LIMITATIONS: bending, standing, squatting, stairs, and locomotion level  PARTICIPATION LIMITATIONS: cleaning, laundry, shopping, community activity, and occupation  PERSONAL FACTORS: Time since onset of injury/illness/exacerbation are also affecting patient's functional outcome.   REHAB POTENTIAL: Good  CLINICAL DECISION MAKING: Stable/uncomplicated  EVALUATION COMPLEXITY: Low   GOALS: Goals reviewed with patient? Yes  SHORT TERM GOALS: Target date: 12/05/2022    Pt to be independent with initial HEP  Goal status: INITIAL  2.  Pt to demo decreased muscle tension in R glute by at least 50 %  Goal status: INITIAL     LONG TERM GOALS: Target date: 01/16/2023    Pt to be independent with final HEP for ankle, knee and back pain  Goal status: INITIAL  2.  Pt to report decreased pain at R SI and in R LE to 0-2/10 to improve ability for functional and community activity   Goal status: INITIAL  3.  Pt to tolerate at least 15 min of standing without  pain greater than 2/10   Goal status: INITIAL    PLAN:  PT FREQUENCY: 1-2x/week  PT DURATION: 8 weeks  PLANNED INTERVENTIONS: Therapeutic exercises, Therapeutic activity, Neuromuscular re-education, Balance training, Gait training, Patient/Family education, Self Care, Joint mobilization, Joint manipulation, Stair training, Orthotic/Fit training, DME instructions, Dry Needling, Electrical stimulation, Spinal manipulation, Spinal mobilization, Cryotherapy, Moist heat, Taping, Traction, Ultrasound, Ionotophoresis '4mg'$ /ml Dexamethasone, and Manual therapy.  PLAN FOR NEXT SESSION: manual for R glute/SI, possible dry needling,    Lyndee Hensen, PT, DPT 8:50 AM  12/05/22

## 2022-12-08 ENCOUNTER — Ambulatory Visit (INDEPENDENT_AMBULATORY_CARE_PROVIDER_SITE_OTHER): Payer: BC Managed Care – PPO | Admitting: Physical Therapy

## 2022-12-08 ENCOUNTER — Encounter: Payer: Self-pay | Admitting: Physical Therapy

## 2022-12-08 DIAGNOSIS — G8929 Other chronic pain: Secondary | ICD-10-CM | POA: Diagnosis not present

## 2022-12-08 DIAGNOSIS — M5459 Other low back pain: Secondary | ICD-10-CM | POA: Diagnosis not present

## 2022-12-08 DIAGNOSIS — M25572 Pain in left ankle and joints of left foot: Secondary | ICD-10-CM

## 2022-12-08 DIAGNOSIS — M25561 Pain in right knee: Secondary | ICD-10-CM | POA: Diagnosis not present

## 2022-12-08 NOTE — Therapy (Signed)
OUTPATIENT PHYSICAL THERAPY THORACOLUMBAR TREATMENT    Patient Name: Christine Webb MRN: [id number] DOB:30-Jan-1971, 52 y.o., female Today's Date: 12/08/2022  END OF SESSION:  PT End of Session - 12/08/22 0802     Visit Number 4    Number of Visits 16    Date for PT Re-Evaluation 01/16/23    Authorization Type BC[employer]    PT Start Time 0803    PT Stop Time 0845    PT Time Calculation (min) 42 min    Activity Tolerance Patient tolerated treatment well    Behavior During Therapy [facility] for tasks assessed/performed              Past Medical History:  Diagnosis Date   Allergy    Arthritis    History of chicken pox    History of shingles    Hypertension    Iron deficiency anemia    Ablation; did require transfusions   Past Surgical History:  Procedure Laterality Date   CESAREAN SECTION  2002, 2004   CHOLECYSTECTOMY N/A 12/07/2020   Procedure: LAPAROSCOPIC CHOLECYSTECTOMY WITH INTRAOPERATIVE CHOLANGIOGRAM;  Surgeon: Armandina Gemma, MD;  Location: [facility];  Service: General;  Laterality: N/A;  ROOM 1 STARTING AT 07:30AM FOR 90 MIN   KNEE SURGERY  2012   3 surgeries   Patient Active Problem List   Diagnosis Date Noted   Chronic vertigo 11/01/2021   Hypertension     PCP: Inda Coke  REFERRING PROVIDER: Marylynn Pearson  REFERRING DIAG: Sciatica, right side  Rationale for Evaluation and Treatment: Rehabilitation  THERAPY DIAG:  Other low back pain  Pain in left ankle and joints of left foot  Chronic pain of right knee  ONSET DATE: 3 years ago  SUBJECTIVE:                                                                                                                                                                                           SUBJECTIVE STATEMENT: 12/08/2022 pt states variable pain, still having pain down her leg.   Eval: Pt reports pain for about 3 years, Pain R low back, all the way down R LE. Started to get much worse in October. Standing is main  problem and what brings on leg pain. Can only stand for a few minutes then pain stairs. States better with walking, movement, sitting. Can walk up to 2 mi/daily with hills and no pain in back.  Previous R knee pain - Skiing accident with  fx in tibia, acl repair and knee scope More recently- medial pain with walking hills, had shot, has been ok. Labor day- sprained both ankles, L  more severe, mild soreness at lateral ankle on L, feel stiff in AM.   Exercise: Walks , 2 mi/day, not doing strengthening at this time due to pain   Work: full time, sits at desk, has standing option.    Previous knee pain-  MRI from August 2023:   PERTINENT HISTORY: none   PAIN:  Are you having pain? Yes: NPRS scale: up to 8/10 Pain location: R SI, down R LE/posteriorly  Pain description: Sore Aggravating factors: standing  Relieving factors: movement  PRECAUTIONS: None  WEIGHT BEARING RESTRICTIONS: No  FALLS:  Has patient fallen in last 6 months? Yes. Number of falls 1 Night, friends house, missed steps.   PLOF: Independent  PATIENT GOALS: Decreased pain with standing    OBJECTIVE:   DIAGNOSTIC FINDINGS:    PATIENT SURVEYS:  Foto-   COGNITION: Overall cognitive status: Within functional limits for tasks assessed     SENSATION: WFL  POSTURE:  Standing: R knee in mild flexion,  feet: mostly neutral foot alignment, Hips appear even.   PALPATION: Supine: L LE shorter, R longer, long sit test negative.  Standing: R knee in slight flexion , back looks even.  Ankle: mild soreness at L lateral ankle Knee: no pain to palpate, did have pain at patella tendon at medial patella.  Back: pain at R SI. And surrounding glue muscle  LUMBAR ROM:   AROM eval  Flexion wfl  Extension wfl  Right lateral flexion   Left lateral flexion   Right rotation   Left rotation    (Blank rows = not tested)  LOWER EXTREMITY ROM:     Hips: WFL,  Knees: WFL  LOWER EXTREMITY MMT:    MMT Right eval  Left eval  Hip flexion 4+ 4+  Hip extension    Hip abduction 4+ 4+  Hip adduction    Hip internal rotation    Hip external rotation    Knee flexion 5 5  Knee extension 5 5  Ankle dorsiflexion    Ankle plantarflexion    Ankle inversion    Ankle eversion     (Blank rows = not tested)  LUMBAR SPECIAL TESTS:  Long sit test Neg, Painful SI. Painful stork on R.    TODAY'S TREATMENT:                                                                                                                              DATE:   12/08/2022 Therapeutic Exercise: Aerobic: Supine:  Bridging 2 x 10;  S/L:    Seated: Standing:  Hip abd and ext 2 x 10 ea on R;  Stretches: seated fig 4 piriformis x 2 ea bil;   LTR x 15; Standing QL on R;  Side glides to R x 15;  (L causes pain) ; Seated modified childs pose (for HEP);   Neuromuscular Re-education: Manual Therapy: DTM R low lumbar , Lumbar PA mobs,  long leg distraction on R.  Self Care: Modalities:     12/05/2022 Therapeutic Exercise: Aerobic: Supine:  Bridging 2 x 10;  S/L:   Hip abd 2 x 10 on R;   Seated: Standing:  Hip abd and ext 2 x 10 ea bil;  Stretches: supine fig 4 piriformis x 2 ea bil;  Hip IR across body x 3 on R;   LTR x 15;  Neuromuscular Re-education: Manual Therapy: DTM R  glute min, med, piriformis,  long leg distraction on R. MET with R hip flexion;  Self Care: Modalities:  Moist heat pack at end of session to R glute, in S/L. X 10 min  Trigger Point Dry-Needling  Treatment instructions: Expect mild to moderate muscle soreness. S/S of pneumothorax if dry needled over a lung field, and to seek immediate medical attention should they occur. Patient verbalized understanding of these instructions and education.  Patient Consent Given: Yes Education handout provided: Yes Muscles treated: piriformis, deep rotators  Electrical stimulation performed: No Parameters: N/A Treatment response/outcome:, palpable increase in muscle length.     PATIENT EDUCATION:  Education details: updated and reviewed HEP Person educated: Patient Education method: Explanation, Demonstration, Tactile cues, Verbal cues, and Handouts Education comprehension: verbalized understanding, returned demonstration, verbal cues required, tactile cues required, and needs further education  HOME EXERCISE PROGRAM: Access Code: [id number]   ASSESSMENT:  CLINICAL IMPRESSION: 12/08/2022 Pt with continued pain with loading of R side,SLS, and SB to R, with pain into LE/calf. More focus on lumbar today, with manual and ther ex. Updated HEP, discussed more frequent opening/mobility for back and R side this week. Pain and tenderness in glute and SI improving, but still having radicular pain, likley from L-spine.   Eval: Patient presents with primary complaint of pain in R low back. Pts pain consistent with SI pain, and has most pain with standing. She also has previous problems with R knee, now with mild pain, and knee staying in slight flexion in stance. Pt with decreased ability for full functional activities due to pain and deficit, and will benefit from skilled PT to improve.   OBJECTIVE IMPAIRMENTS: Abnormal gait, decreased activity tolerance, decreased mobility, decreased ROM, decreased strength, increased muscle spasms, improper body mechanics, and pain.   ACTIVITY LIMITATIONS: bending, standing, squatting, stairs, and locomotion level  PARTICIPATION LIMITATIONS: cleaning, laundry, shopping, community activity, and occupation  PERSONAL FACTORS: Time since onset of injury/illness/exacerbation are also affecting patient's functional outcome.   REHAB POTENTIAL: Good  CLINICAL DECISION MAKING: Stable/uncomplicated  EVALUATION COMPLEXITY: Low   GOALS: Goals reviewed with patient? Yes  SHORT TERM GOALS: Target date: 12/05/2022    Pt to be independent with initial HEP  Goal status: INITIAL  2.  Pt to demo decreased muscle tension in R glute by at  least 50 %  Goal status: INITIAL     LONG TERM GOALS: Target date: 01/16/2023    Pt to be independent with final HEP for ankle, knee and back pain  Goal status: INITIAL  2.  Pt to report decreased pain at R SI and in R LE to 0-2/10 to improve ability for functional and community activity   Goal status: INITIAL  3.  Pt to tolerate at least 15 min of standing without pain greater than 2/10   Goal status: INITIAL    PLAN:  PT FREQUENCY: 1-2x/week  PT DURATION: 8 weeks  PLANNED INTERVENTIONS: Therapeutic exercises, Therapeutic activity, Neuromuscular re-education, Balance training, Gait training, Patient/Family education, Self Care, Joint mobilization, Joint manipulation, Stair training, Orthotic/Fit training, DME  instructions, Dry Needling, Electrical stimulation, Spinal manipulation, Spinal mobilization, Cryotherapy, Moist heat, Taping, Traction, Ultrasound, Ionotophoresis '4mg'$ /ml Dexamethasone, and Manual therapy.  PLAN FOR NEXT SESSION: manual for R glute/SI, possible dry needling,    Lyndee Hensen, PT, DPT 8:02 AM  12/08/22

## 2022-12-11 ENCOUNTER — Encounter: Payer: Self-pay | Admitting: Physical Therapy

## 2022-12-11 ENCOUNTER — Ambulatory Visit (INDEPENDENT_AMBULATORY_CARE_PROVIDER_SITE_OTHER): Payer: BC Managed Care – PPO | Admitting: Physical Therapy

## 2022-12-11 DIAGNOSIS — M25572 Pain in left ankle and joints of left foot: Secondary | ICD-10-CM

## 2022-12-11 DIAGNOSIS — G8929 Other chronic pain: Secondary | ICD-10-CM | POA: Diagnosis not present

## 2022-12-11 DIAGNOSIS — M5459 Other low back pain: Secondary | ICD-10-CM | POA: Diagnosis not present

## 2022-12-11 DIAGNOSIS — M25561 Pain in right knee: Secondary | ICD-10-CM

## 2022-12-11 NOTE — Therapy (Signed)
OUTPATIENT PHYSICAL THERAPY THORACOLUMBAR TREATMENT    Patient Name: Christine Webb MRN: [id number] DOB:29-Jul-1971, 52 y.o., female Today's Date: 12/11/2022  END OF SESSION:  PT End of Session - 12/11/22 0847     Visit Number 5    Number of Visits 16    Date for PT Re-Evaluation 01/16/23    Authorization Type [employer]    PT Start Time 0848    PT Stop Time 0928    PT Time Calculation (min) 40 min    Activity Tolerance Patient tolerated treatment well    Behavior During Therapy [facility] for tasks assessed/performed              Past Medical History:  Diagnosis Date   Allergy    Arthritis    History of chicken pox    History of shingles    Hypertension    Iron deficiency anemia    Ablation; did require transfusions   Past Surgical History:  Procedure Laterality Date   CESAREAN SECTION  2002, 2004   CHOLECYSTECTOMY N/A 12/07/2020   Procedure: LAPAROSCOPIC CHOLECYSTECTOMY WITH INTRAOPERATIVE CHOLANGIOGRAM;  Surgeon: Armandina Gemma, MD;  Location: [facility];  Service: General;  Laterality: N/A;  ROOM 1 STARTING AT 07:30AM FOR 90 MIN   KNEE SURGERY  2012   3 surgeries   Patient Active Problem List   Diagnosis Date Noted   Chronic vertigo 11/01/2021   Hypertension     PCP: Inda Coke  REFERRING PROVIDER: Marylynn Pearson  REFERRING DIAG: Sciatica, right side  Rationale for Evaluation and Treatment: Rehabilitation  THERAPY DIAG:  Other low back pain  Pain in left ankle and joints of left foot  Chronic pain of right knee  ONSET DATE: 3 years ago  SUBJECTIVE:                                                                                                                                                                                           SUBJECTIVE STATEMENT: 12/11/2022 pt states less pain in last few days  Eval: Pt reports pain for about 3 years, Pain R low back, all the way down R LE. Started to get much worse in October. Standing is main problem and what brings  on leg pain. Can only stand for a few minutes then pain stairs. States better with walking, movement, sitting. Can walk up to 2 mi/daily with hills and no pain in back.  Previous R knee pain - Skiing accident with  fx in tibia, acl repair and knee scope More recently- medial pain with walking hills, had shot, has been ok. Labor day- sprained both ankles, L more severe, mild  soreness at lateral ankle on L, feel stiff in AM.   Exercise: Walks , 2 mi/day, not doing strengthening at this time due to pain   Work: full time, sits at desk, has standing option.    Previous knee pain-  MRI from August 2023:   PERTINENT HISTORY: none  PAIN:  Are you having pain? Yes: NPRS scale: up to 8/10 Pain location: R SI, down R LE/posteriorly  Pain description: Sore Aggravating factors: standing  Relieving factors: movement  PRECAUTIONS: None  WEIGHT BEARING RESTRICTIONS: No  FALLS:  Has patient fallen in last 6 months? Yes. Number of falls 1 Night, friends house, missed steps.   PLOF: Independent  PATIENT GOALS: Decreased pain with standing    OBJECTIVE:   DIAGNOSTIC FINDINGS:    PATIENT SURVEYS:  Foto-   COGNITION: Overall cognitive status: Within functional limits for tasks assessed     SENSATION: WFL  POSTURE:  Standing: R knee in mild flexion,  feet: mostly neutral foot alignment, Hips appear even.   PALPATION: Supine: L LE shorter, R longer, long sit test negative.  Standing: R knee in slight flexion , back looks even.  Ankle: mild soreness at L lateral ankle Knee: no pain to palpate, did have pain at patella tendon at medial patella.  Back: pain at R SI. And surrounding glue muscle  LUMBAR ROM:   AROM eval  Flexion wfl  Extension wfl  Right lateral flexion   Left lateral flexion   Right rotation   Left rotation    (Blank rows = not tested)  LOWER EXTREMITY ROM:     Hips: WFL,  Knees: WFL  LOWER EXTREMITY MMT:    MMT Right eval Left eval  Hip flexion 4+ 4+   Hip extension    Hip abduction 4+ 4+  Hip adduction    Hip internal rotation    Hip external rotation    Knee flexion 5 5  Knee extension 5 5  Ankle dorsiflexion    Ankle plantarflexion    Ankle inversion    Ankle eversion     (Blank rows = not tested)  LUMBAR SPECIAL TESTS:  Long sit test Neg, Painful SI. Painful stork on R.    TODAY'S TREATMENT:                                                                                                                              DATE:   12/11/2022 Therapeutic Exercise: Aerobic: Supine:  Bridging 2 x 10;  SLR x 10 bil with TA;  Prone hip ext x 10 bil;  S/L:    Seated: Standing:   Stretches: seated fig 4 piriformis x 2 ea bil;   LTR x 15; Standing QL on R;  Side glides to R x 15; Childs pose x 3   Neuromuscular Re-education: Manual Therapy: DTM R low lumbar , Lumbar PA mobs,  long leg distraction on R. Self Care: Modalities:  12/05/2022 Therapeutic Exercise: Aerobic: Supine:  Bridging 2 x 10;  S/L:   Hip abd 2 x 10 on R;   Seated: Standing:  Hip abd and ext 2 x 10 ea bil;  Stretches: supine fig 4 piriformis x 2 ea bil;  Hip IR across body x 3 on R;   LTR x 15;  Neuromuscular Re-education: Manual Therapy: DTM R  glute min, med, piriformis,  long leg distraction on R. MET with R hip flexion;  Self Care: Modalities:  Moist heat pack at end of session to R glute, in S/L. X 10 min  Trigger Point Dry-Needling  Treatment instructions: Expect mild to moderate muscle soreness. S/S of pneumothorax if dry needled over a lung field, and to seek immediate medical attention should they occur. Patient verbalized understanding of these instructions and education.  Patient Consent Given: Yes Education handout provided: Yes Muscles treated: piriformis, deep rotators  Electrical stimulation performed: No Parameters: N/A Treatment response/outcome:, palpable increase in muscle length.    PATIENT EDUCATION:  Education details: updated  and reviewed HEP Person educated: Patient Education method: Explanation, Demonstration, Tactile cues, Verbal cues, and Handouts Education comprehension: verbalized understanding, returned demonstration, verbal cues required, tactile cues required, and needs further education  HOME EXERCISE PROGRAM: Access Code: [id number]   ASSESSMENT:  CLINICAL IMPRESSION: 12/11/2022  Pt with decreased pain today with activities. Continued work on low back with manual and ther ex. Plan to progress as tolerated. Will benefit on continued education on core stability.  Eval: Patient presents with primary complaint of pain in R low back. Pts pain consistent with SI pain, and has most pain with standing. She also has previous problems with R knee, now with mild pain, and knee staying in slight flexion in stance. Pt with decreased ability for full functional activities due to pain and deficit, and will benefit from skilled PT to improve.   OBJECTIVE IMPAIRMENTS: Abnormal gait, decreased activity tolerance, decreased mobility, decreased ROM, decreased strength, increased muscle spasms, improper body mechanics, and pain.   ACTIVITY LIMITATIONS: bending, standing, squatting, stairs, and locomotion level  PARTICIPATION LIMITATIONS: cleaning, laundry, shopping, community activity, and occupation  PERSONAL FACTORS: Time since onset of injury/illness/exacerbation are also affecting patient's functional outcome.   REHAB POTENTIAL: Good  CLINICAL DECISION MAKING: Stable/uncomplicated  EVALUATION COMPLEXITY: Low   GOALS: Goals reviewed with patient? Yes  SHORT TERM GOALS: Target date: 12/05/2022    Pt to be independent with initial HEP  Goal status: INITIAL  2.  Pt to demo decreased muscle tension in R glute by at least 50 %  Goal status: INITIAL     LONG TERM GOALS: Target date: 01/16/2023    Pt to be independent with final HEP for ankle, knee and back pain  Goal status: INITIAL  2.  Pt to report  decreased pain at R SI and in R LE to 0-2/10 to improve ability for functional and community activity   Goal status: INITIAL  3.  Pt to tolerate at least 15 min of standing without pain greater than 2/10   Goal status: INITIAL    PLAN:  PT FREQUENCY: 1-2x/week  PT DURATION: 8 weeks  PLANNED INTERVENTIONS: Therapeutic exercises, Therapeutic activity, Neuromuscular re-education, Balance training, Gait training, Patient/Family education, Self Care, Joint mobilization, Joint manipulation, Stair training, Orthotic/Fit training, DME instructions, Dry Needling, Electrical stimulation, Spinal manipulation, Spinal mobilization, Cryotherapy, Moist heat, Taping, Traction, Ultrasound, Ionotophoresis '4mg'$ /ml Dexamethasone, and Manual therapy.  PLAN FOR NEXT SESSION: manual for r lumbar, decompression, core strength,,  possible dry needling,    Lyndee Hensen, PT, DPT 12:51 PM  12/11/22

## 2022-12-17 ENCOUNTER — Ambulatory Visit (INDEPENDENT_AMBULATORY_CARE_PROVIDER_SITE_OTHER): Payer: BC Managed Care – PPO | Admitting: Physical Therapy

## 2022-12-17 ENCOUNTER — Encounter: Payer: Self-pay | Admitting: Physical Therapy

## 2022-12-17 DIAGNOSIS — G8929 Other chronic pain: Secondary | ICD-10-CM | POA: Diagnosis not present

## 2022-12-17 DIAGNOSIS — M25561 Pain in right knee: Secondary | ICD-10-CM

## 2022-12-17 DIAGNOSIS — M25572 Pain in left ankle and joints of left foot: Secondary | ICD-10-CM | POA: Diagnosis not present

## 2022-12-17 DIAGNOSIS — M5459 Other low back pain: Secondary | ICD-10-CM

## 2022-12-17 NOTE — Therapy (Signed)
OUTPATIENT PHYSICAL THERAPY THORACOLUMBAR TREATMENT    Patient Name: Christine Webb MRN: [id number] DOB:03/29/1971, 52 y.o., female Today's Date: 12/17/2022  END OF SESSION:  PT End of Session - 12/17/22 0803     Visit Number 6    Number of Visits 16    Date for PT Re-Evaluation 01/16/23    Authorization Type BC[employer]    PT Start Time 0805    PT Stop Time 0845    PT Time Calculation (min) 40 min    Activity Tolerance Patient tolerated treatment well    Behavior During Therapy [facility] for tasks assessed/performed              Past Medical History:  Diagnosis Date   Allergy    Arthritis    History of chicken pox    History of shingles    Hypertension    Iron deficiency anemia    Ablation; did require transfusions   Past Surgical History:  Procedure Laterality Date   CESAREAN SECTION  2002, 2004   CHOLECYSTECTOMY N/A 12/07/2020   Procedure: LAPAROSCOPIC CHOLECYSTECTOMY WITH INTRAOPERATIVE CHOLANGIOGRAM;  Surgeon: Armandina Gemma, MD;  Location: [facility];  Service: General;  Laterality: N/A;  ROOM 1 STARTING AT 07:30AM FOR 90 MIN   KNEE SURGERY  2012   3 surgeries   Patient Active Problem List   Diagnosis Date Noted   Chronic vertigo 11/01/2021   Hypertension     PCP: Inda Coke  REFERRING PROVIDER: Marylynn Pearson  REFERRING DIAG: Sciatica, right side  Rationale for Evaluation and Treatment: Rehabilitation  THERAPY DIAG:  Other low back pain  Pain in left ankle and joints of left foot  Chronic pain of right knee  ONSET DATE: 3 years ago  SUBJECTIVE:                                                                                                                                                                                           SUBJECTIVE STATEMENT: 12/11/2022 Pt still has tingling sensation into LE, with only a few minutes of standing. Pain intensity is much less. Less pain in back/SI, but still a bit sore there.   Eval: Pt reports pain for about 3  years, Pain R low back, all the way down R LE. Started to get much worse in October. Standing is main problem and what brings on leg pain. Can only stand for a few minutes then pain stairs. States better with walking, movement, sitting. Can walk up to 2 mi/daily with hills and no pain in back.  Previous R knee pain - Skiing accident with  fx in tibia, acl repair and knee  scope More recently- medial pain with walking hills, had shot, has been ok. Labor day- sprained both ankles, L more severe, mild soreness at lateral ankle on L, feel stiff in AM.   Exercise: Walks , 2 mi/day, not doing strengthening at this time due to pain   Work: full time, sits at desk, has standing option.    Previous knee pain-  MRI from August 2023:   PERTINENT HISTORY: none  PAIN:  Are you having pain? Yes: NPRS scale: up to 8/10 Pain location: R SI, down R LE/posteriorly  Pain description: Sore Aggravating factors: standing  Relieving factors: movement  PRECAUTIONS: None  WEIGHT BEARING RESTRICTIONS: No  FALLS:  Has patient fallen in last 6 months? Yes. Number of falls 1 Night, friends house, missed steps.   PLOF: Independent  PATIENT GOALS: Decreased pain with standing    OBJECTIVE:   DIAGNOSTIC FINDINGS:    PATIENT SURVEYS:  Foto-   COGNITION: Overall cognitive status: Within functional limits for tasks assessed     SENSATION: WFL  POSTURE:  Standing: R knee in mild flexion,  feet: mostly neutral foot alignment, Hips appear even.   PALPATION: Supine: L LE shorter, R longer, long sit test negative.  Standing: R knee in slight flexion , back looks even.  Ankle: mild soreness at L lateral ankle Knee: no pain to palpate, did have pain at patella tendon at medial patella.  Back: pain at R SI. And surrounding glue muscle  LUMBAR ROM:   AROM eval  Flexion wfl  Extension wfl  Right lateral flexion   Left lateral flexion   Right rotation   Left rotation    (Blank rows = not  tested)  LOWER EXTREMITY ROM:     Hips: WFL,  Knees: WFL  LOWER EXTREMITY MMT:    MMT Right eval Left eval  Hip flexion 4+ 4+  Hip extension    Hip abduction 4+ 4+  Hip adduction    Hip internal rotation    Hip external rotation    Knee flexion 5 5  Knee extension 5 5  Ankle dorsiflexion    Ankle plantarflexion    Ankle inversion    Ankle eversion     (Blank rows = not tested)  LUMBAR SPECIAL TESTS:  Long sit test Neg, Painful SI. Painful stork on R.    TODAY'S TREATMENT:                                                                                                                              DATE:   12/17/2022 Therapeutic Exercise: Aerobic:  Supine:  Bridging 2 x 10;   Prone hip ext x 10 bil;  S/L:   Hip abd 2 x 10 bil;  Seated:  Sit to stand x 10 for leg/quad strength;  Standing:  Step ups 6 in x 10 bil, for quad strength (inc leg pain at end of set)  Stretches:   LTR x 15;  Cat/cow x 15;  Neuromuscular Re-education: Manual Therapy: DTM R low lumbar , Lumbar PA mobs,  long leg distraction on R. Self Care: Modalities:     12/05/2022 Therapeutic Exercise: Aerobic: Supine:  Bridging 2 x 10;  S/L:   Hip abd 2 x 10 on R;   Seated: Standing:  Hip abd and ext 2 x 10 ea bil;  Stretches: supine fig 4 piriformis x 2 ea bil;  Hip IR across body x 3 on R;   LTR x 15;  Neuromuscular Re-education: Manual Therapy: DTM R  glute min, med, piriformis,  long leg distraction on R. MET with R hip flexion;  Self Care: Modalities:  Moist heat pack at end of session to R glute, in S/L. X 10 min  Trigger Point Dry-Needling  Treatment instructions: Expect mild to moderate muscle soreness. S/S of pneumothorax if dry needled over a lung field, and to seek immediate medical attention should they occur. Patient verbalized understanding of these instructions and education.    PATIENT EDUCATION:  Education details: updated and reviewed HEP Person educated: Patient Education  method: Explanation, Demonstration, Tactile cues, Verbal cues, and Handouts Education comprehension: verbalized understanding, returned demonstration, verbal cues required, tactile cues required, and needs further education  HOME EXERCISE PROGRAM: Access Code: [id number]   ASSESSMENT:  CLINICAL IMPRESSION: 12/17/2022  Pt with noted tightness in R low lumbar region. Will try Dry needling to lumbar region next visit. Pt with good ability for exercises and strengthening. She continues to have onset of LE symptoms with standing, but overall pain level much improved.   Eval: Patient presents with primary complaint of pain in R low back. Pts pain consistent with SI pain, and has most pain with standing. She also has previous problems with R knee, now with mild pain, and knee staying in slight flexion in stance. Pt with decreased ability for full functional activities due to pain and deficit, and will benefit from skilled PT to improve.   OBJECTIVE IMPAIRMENTS: Abnormal gait, decreased activity tolerance, decreased mobility, decreased ROM, decreased strength, increased muscle spasms, improper body mechanics, and pain.   ACTIVITY LIMITATIONS: bending, standing, squatting, stairs, and locomotion level  PARTICIPATION LIMITATIONS: cleaning, laundry, shopping, community activity, and occupation  PERSONAL FACTORS: Time since onset of injury/illness/exacerbation are also affecting patient's functional outcome.   REHAB POTENTIAL: Good  CLINICAL DECISION MAKING: Stable/uncomplicated  EVALUATION COMPLEXITY: Low   GOALS: Goals reviewed with patient? Yes  SHORT TERM GOALS: Target date: 12/05/2022    Pt to be independent with initial HEP  Goal status: INITIAL  2.  Pt to demo decreased muscle tension in R glute by at least 50 %  Goal status: INITIAL     LONG TERM GOALS: Target date: 01/16/2023    Pt to be independent with final HEP for ankle, knee and back pain  Goal status: INITIAL  2.   Pt to report decreased pain at R SI and in R LE to 0-2/10 to improve ability for functional and community activity   Goal status: INITIAL  3.  Pt to tolerate at least 15 min of standing without pain greater than 2/10   Goal status: INITIAL    PLAN:  PT FREQUENCY: 1-2x/week  PT DURATION: 8 weeks  PLANNED INTERVENTIONS: Therapeutic exercises, Therapeutic activity, Neuromuscular re-education, Balance training, Gait training, Patient/Family education, Self Care, Joint mobilization, Joint manipulation, Stair training, Orthotic/Fit training, DME instructions, Dry Needling, Electrical stimulation, Spinal manipulation, Spinal mobilization, Cryotherapy, Moist heat, Taping, Traction, Ultrasound, Ionotophoresis 4mg /ml Dexamethasone, and  Manual therapy.  PLAN FOR NEXT SESSION: manual for r lumbar, decompression, core strength,, possible dry needling,    Lyndee Hensen, PT, DPT 9:45 AM  12/17/22

## 2022-12-22 ENCOUNTER — Encounter: Payer: Self-pay | Admitting: Physical Therapy

## 2022-12-22 ENCOUNTER — Ambulatory Visit (INDEPENDENT_AMBULATORY_CARE_PROVIDER_SITE_OTHER): Payer: BC Managed Care – PPO | Admitting: Physical Therapy

## 2022-12-22 DIAGNOSIS — M25561 Pain in right knee: Secondary | ICD-10-CM | POA: Diagnosis not present

## 2022-12-22 DIAGNOSIS — G8929 Other chronic pain: Secondary | ICD-10-CM | POA: Diagnosis not present

## 2022-12-22 DIAGNOSIS — M25572 Pain in left ankle and joints of left foot: Secondary | ICD-10-CM | POA: Diagnosis not present

## 2022-12-22 DIAGNOSIS — M5459 Other low back pain: Secondary | ICD-10-CM | POA: Diagnosis not present

## 2022-12-22 NOTE — Therapy (Signed)
OUTPATIENT PHYSICAL THERAPY THORACOLUMBAR TREATMENT    Patient Name: Christine Webb MRN: [id number] DOB:08-12-1971, 52 y.o., female Today's Date: 12/22/2022  END OF SESSION:  PT End of Session - 12/22/22 0802     Visit Number 7    Number of Visits 16    Date for PT Re-Evaluation 01/16/23    Authorization Type [employer]    PT Start Time 0802    PT Stop Time 0845    PT Time Calculation (min) 43 min    Activity Tolerance Patient tolerated treatment well    Behavior During Therapy [facility] for tasks assessed/performed              Past Medical History:  Diagnosis Date   Allergy    Arthritis    History of chicken pox    History of shingles    Hypertension    Iron deficiency anemia    Ablation; did require transfusions   Past Surgical History:  Procedure Laterality Date   CESAREAN SECTION  2002, 2004   CHOLECYSTECTOMY N/A 12/07/2020   Procedure: LAPAROSCOPIC CHOLECYSTECTOMY WITH INTRAOPERATIVE CHOLANGIOGRAM;  Surgeon: Armandina Gemma, MD;  Location: [facility];  Service: General;  Laterality: N/A;  ROOM 1 STARTING AT 07:30AM FOR 90 MIN   KNEE SURGERY  2012   3 surgeries   Patient Active Problem List   Diagnosis Date Noted   Chronic vertigo 11/01/2021   Hypertension     PCP: Inda Coke  REFERRING PROVIDER: Marylynn Pearson  REFERRING DIAG: Sciatica, right side  Rationale for Evaluation and Treatment: Rehabilitation  THERAPY DIAG:  Other low back pain  Pain in left ankle and joints of left foot  Chronic pain of right knee  ONSET DATE: 3 years ago  SUBJECTIVE:                                                                                                                                                                                           SUBJECTIVE STATEMENT: 12/22/2022 Pt still has tingling sensation into LE, with only a few minutes of standing. Pain intensity is much less.   Eval: Pt reports pain for about 3 years, Pain R low back, all the way down R LE. Started  to get much worse in October. Standing is main problem and what brings on leg pain. Can only stand for a few minutes then pain stairs. States better with walking, movement, sitting. Can walk up to 2 mi/daily with hills and no pain in back.  Previous R knee pain - Skiing accident with  fx in tibia, acl repair and knee scope More recently- medial pain with walking hills, had shot,  has been ok. Labor day- sprained both ankles, L more severe, mild soreness at lateral ankle on L, feel stiff in AM.   Exercise: Walks , 2 mi/day, not doing strengthening at this time due to pain   Work: full time, sits at desk, has standing option.    Previous knee pain-  MRI from August 2023:   PERTINENT HISTORY: none  PAIN:  Are you having pain? Yes: NPRS scale: up to 5/10 Pain location: R SI, down R LE/posteriorly  Pain description: Sore Aggravating factors: standing  Relieving factors: movement  PRECAUTIONS: None  WEIGHT BEARING RESTRICTIONS: No  FALLS:  Has patient fallen in last 6 months? Yes. Number of falls 1 Night, friends house, missed steps.   PLOF: Independent  PATIENT GOALS: Decreased pain with standing    OBJECTIVE:   DIAGNOSTIC FINDINGS:    PATIENT SURVEYS:  Foto-   COGNITION: Overall cognitive status: Within functional limits for tasks assessed     SENSATION: WFL  POSTURE:  Standing: R knee in mild flexion,  feet: mostly neutral foot alignment, Hips appear even.   PALPATION: Supine: L LE shorter, R longer, long sit test negative.  Standing: R knee in slight flexion , back looks even.  Ankle: mild soreness at L lateral ankle Knee: no pain to palpate, did have pain at patella tendon at medial patella.  Back: pain at R SI. And surrounding glue muscle  LUMBAR ROM:   AROM eval  Flexion wfl  Extension wfl  Right lateral flexion   Left lateral flexion   Right rotation   Left rotation    (Blank rows = not tested)  LOWER EXTREMITY ROM:     Hips: WFL,  Knees:  WFL  LOWER EXTREMITY MMT:    MMT Right eval Left eval  Hip flexion 4+ 4+  Hip extension    Hip abduction 4+ 4+  Hip adduction    Hip internal rotation    Hip external rotation    Knee flexion 5 5  Knee extension 5 5  Ankle dorsiflexion    Ankle plantarflexion    Ankle inversion    Ankle eversion     (Blank rows = not tested)  LUMBAR SPECIAL TESTS:  Long sit test Neg, Painful SI. Painful stork on R.    TODAY'S TREATMENT:                                                                                                                              DATE:   12/22/2022 Therapeutic Exercise: Aerobic:  Supine:  Bridging 2 x 10;   Prone hip ext x 10 bil;  S/L:   Hip abd 2 x 10 bil;  Seated:   Standing:   Stretches:   LTR x 15;   Supine fig 4 30 sec x 2 bil; reviewed standing R QL stretch ; manual HSS with ankle pump 2 x 10;  Neuromuscular Re-education: Manual Therapy: DTM R low lumbar , Lumbar  PA mobs,  long leg distraction on R. Self Care: Modalities:  Most heat pack x 10 min at end of session.  Trigger Point Dry-Needling  Treatment instructions: Expect mild to moderate muscle soreness. S/S of pneumothorax if dry needled over a lung field, and to seek immediate medical attention should they occur. Patient verbalized understanding of these instructions and education.  Patient Consent Given: Yes Education handout provided: Previously provided Muscles treated: R lumbar paraspinals L3/4  Electrical stimulation performed: No Parameters: N/A Treatment response/outcome: Twitch response, palpable increase in muscle length.    Therapeutic Exercise: Aerobic:  Supine:  Bridging 2 x 10;   Prone hip ext x 10 bil;  S/L:   Hip abd 2 x 10 bil;  Seated:  Sit to stand x 10 for leg/quad strength;  Standing:  Step ups 6 in x 10 bil, for quad strength (inc leg pain at end of set)  Stretches:   LTR x 15;   Cat/cow x 15;  Neuromuscular Re-education: Manual Therapy: DTM R low lumbar ,  Lumbar PA mobs,  long leg distraction on R. Self Care: Modalities   12/05/2022 Therapeutic Exercise: Aerobic: Supine:  Bridging 2 x 10;  S/L:   Hip abd 2 x 10 on R;   Seated: Standing:  Hip abd and ext 2 x 10 ea bil;  Stretches: supine fig 4 piriformis x 2 ea bil;  Hip IR across body x 3 on R;   LTR x 15;  Neuromuscular Re-education: Manual Therapy: DTM R  glute min, med, piriformis,  long leg distraction on R. MET with R hip flexion;  Self Care: Modalities:  Moist heat pack at end of session to R glute, in S/L. X 10 min  Trigger Point Dry-Needling  Treatment instructions: Expect mild to moderate muscle soreness. S/S of pneumothorax if dry needled over a lung field, and to seek immediate medical attention should they occur. Patient verbalized understanding of these instructions and education.    PATIENT EDUCATION:  Education details: updated and reviewed HEP Person educated: Patient Education method: Explanation, Demonstration, Tactile cues, Verbal cues, and Handouts Education comprehension: verbalized understanding, returned demonstration, verbal cues required, tactile cues required, and needs further education  HOME EXERCISE PROGRAM: Access Code: [id number]   ASSESSMENT:  CLINICAL IMPRESSION: 12/22/2022 Pt with improving pain intensity, but still having symptoms into LE with only a few minutes of standing. Trial for DN to lumbar region today, pt with good tolerance. Continued strength and stabilization, pt to benefit from progression of this as able. Possible trial for heel lift if pain not improving, due to leg length deficit.   Eval: Patient presents with primary complaint of pain in R low back. Pts pain consistent with SI pain, and has most pain with standing. She also has previous problems with R knee, now with mild pain, and knee staying in slight flexion in stance. Pt with decreased ability for full functional activities due to pain and deficit, and will benefit from skilled  PT to improve.   OBJECTIVE IMPAIRMENTS: Abnormal gait, decreased activity tolerance, decreased mobility, decreased ROM, decreased strength, increased muscle spasms, improper body mechanics, and pain.   ACTIVITY LIMITATIONS: bending, standing, squatting, stairs, and locomotion level  PARTICIPATION LIMITATIONS: cleaning, laundry, shopping, community activity, and occupation  PERSONAL FACTORS: Time since onset of injury/illness/exacerbation are also affecting patient's functional outcome.   REHAB POTENTIAL: Good  CLINICAL DECISION MAKING: Stable/uncomplicated  EVALUATION COMPLEXITY: Low   GOALS: Goals reviewed with patient? Yes  SHORT TERM GOALS: Target date:  12/05/2022    Pt to be independent with initial HEP  Goal status: INITIAL  2.  Pt to demo decreased muscle tension in R glute by at least 50 %  Goal status: INITIAL     LONG TERM GOALS: Target date: 01/16/2023    Pt to be independent with final HEP for ankle, knee and back pain  Goal status: INITIAL  2.  Pt to report decreased pain at R SI and in R LE to 0-2/10 to improve ability for functional and community activity   Goal status: INITIAL  3.  Pt to tolerate at least 15 min of standing without pain greater than 2/10   Goal status: INITIAL    PLAN:  PT FREQUENCY: 1-2x/week  PT DURATION: 8 weeks  PLANNED INTERVENTIONS: Therapeutic exercises, Therapeutic activity, Neuromuscular re-education, Balance training, Gait training, Patient/Family education, Self Care, Joint mobilization, Joint manipulation, Stair training, Orthotic/Fit training, DME instructions, Dry Needling, Electrical stimulation, Spinal manipulation, Spinal mobilization, Cryotherapy, Moist heat, Taping, Traction, Ultrasound, Ionotophoresis 4mg /ml Dexamethasone, and Manual therapy.  PLAN FOR NEXT SESSION: manual for r lumbar, decompression, core strength,, possible dry needling,    Lyndee Hensen, PT, DPT 8:02 AM  12/22/22

## 2022-12-24 ENCOUNTER — Encounter: Payer: Self-pay | Admitting: Physical Therapy

## 2022-12-24 ENCOUNTER — Ambulatory Visit (INDEPENDENT_AMBULATORY_CARE_PROVIDER_SITE_OTHER): Payer: BC Managed Care – PPO | Admitting: Physical Therapy

## 2022-12-24 DIAGNOSIS — G8929 Other chronic pain: Secondary | ICD-10-CM | POA: Diagnosis not present

## 2022-12-24 DIAGNOSIS — M25572 Pain in left ankle and joints of left foot: Secondary | ICD-10-CM

## 2022-12-24 DIAGNOSIS — M5459 Other low back pain: Secondary | ICD-10-CM

## 2022-12-24 DIAGNOSIS — M25561 Pain in right knee: Secondary | ICD-10-CM

## 2022-12-24 NOTE — Therapy (Signed)
OUTPATIENT PHYSICAL THERAPY THORACOLUMBAR TREATMENT    Patient Name: Christine Webb MRN: [id number] DOB:24-Jan-1971, 52 y.o., female Today's Date: 12/24/2022  END OF SESSION:  PT End of Session - 12/24/22 0800     Visit Number 8    Number of Visits 16    Date for PT Re-Evaluation 01/16/23    Authorization Type BC[employer]    PT Start Time 0803    PT Stop Time 0841    PT Time Calculation (min) 38 min    Activity Tolerance Patient tolerated treatment well;Other (comment)    Behavior During Therapy WFL for tasks assessed/performed              Past Medical History:  Diagnosis Date   Allergy    Arthritis    History of chicken pox    History of shingles    Hypertension    Iron deficiency anemia    Ablation; did require transfusions   Past Surgical History:  Procedure Laterality Date   CESAREAN SECTION  2002, 2004   CHOLECYSTECTOMY N/A 12/07/2020   Procedure: LAPAROSCOPIC CHOLECYSTECTOMY WITH INTRAOPERATIVE CHOLANGIOGRAM;  Surgeon: Armandina Gemma, MD;  Location: [facility];  Service: General;  Laterality: N/A;  ROOM 1 STARTING AT 07:30AM FOR 90 MIN   KNEE SURGERY  2012   3 surgeries   Patient Active Problem List   Diagnosis Date Noted   Chronic vertigo 11/01/2021   Hypertension     PCP: Inda Coke  REFERRING PROVIDER: Marylynn Pearson  REFERRING DIAG: Sciatica, right side  Rationale for Evaluation and Treatment: Rehabilitation  THERAPY DIAG:  Other low back pain  Pain in left ankle and joints of left foot  Chronic pain of right knee  ONSET DATE: 3 years ago  SUBJECTIVE:                                                                                                                                                                                           SUBJECTIVE STATEMENT: 12/24/2022 Pt still has pain.   Eval: Pt reports pain for about 3 years, Pain R low back, all the way down R LE. Started to get much worse in October. Standing is main problem and what brings  on leg pain. Can only stand for a few minutes then pain stairs. States better with walking, movement, sitting. Can walk up to 2 mi/daily with hills and no pain in back.  Previous R knee pain - Skiing accident with  fx in tibia, acl repair and knee scope More recently- medial pain with walking hills, had shot, has been ok. Labor day- sprained both ankles, L more severe, mild soreness at  lateral ankle on L, feel stiff in AM.   Exercise: Walks , 2 mi/day, not doing strengthening at this time due to pain   Work: full time, sits at desk, has standing option.    Previous knee pain-  MRI from August 2023:   PERTINENT HISTORY: none  PAIN:  Are you having pain? Yes: NPRS scale: up to 5/10 Pain location: R SI, down R LE/posteriorly  Pain description: Sore Aggravating factors: standing  Relieving factors: movement  PRECAUTIONS: None  WEIGHT BEARING RESTRICTIONS: No  FALLS:  Has patient fallen in last 6 months? Yes. Number of falls 1 Night, friends house, missed steps.   PLOF: Independent  PATIENT GOALS: Decreased pain with standing    OBJECTIVE:   DIAGNOSTIC FINDINGS:    PATIENT SURVEYS:  Foto-   COGNITION: Overall cognitive status: Within functional limits for tasks assessed     SENSATION: WFL  POSTURE:  Standing: R knee in mild flexion,  feet: mostly neutral foot alignment, Hips appear even.   PALPATION: Supine: L LE shorter, R longer, long sit test negative.  Standing: R knee in slight flexion , back looks even.  Ankle: mild soreness at L lateral ankle Knee: no pain to palpate, did have pain at patella tendon at medial patella.  Back: pain at R SI. And surrounding glue muscle  LUMBAR ROM:   AROM eval  Flexion wfl  Extension wfl  Right lateral flexion   Left lateral flexion   Right rotation   Left rotation    (Blank rows = not tested)  LOWER EXTREMITY ROM:     Hips: WFL,  Knees: WFL  LOWER EXTREMITY MMT:    MMT Right eval Left eval  Hip flexion 4+ 4+   Hip extension    Hip abduction 4+ 4+  Hip adduction    Hip internal rotation    Hip external rotation    Knee flexion 5 5  Knee extension 5 5  Ankle dorsiflexion    Ankle plantarflexion    Ankle inversion    Ankle eversion     (Blank rows = not tested)  LUMBAR SPECIAL TESTS:  Long sit test Neg, Painful SI. Painful stork on R.    TODAY'S TREATMENT:                                                                                                                              DATE:   12/24/2022 Therapeutic Exercise: Aerobic:  Supine:  Bridging 2 x 10;   S/L:   UE/LE reach for decompression of R lumbar 5 sec x 10;  Seated:   Standing:  Side glides to L and R (pain)  Stretches:   LTR x 15;   Supine fig 4 30 sec x 2 bil; reviewed standing R QL stretch ;  Neuromuscular Re-education: Manual Therapy: DTM R low lumbar and QL ;  long leg distraction on R. Self Care: Modalities:   Other : heel lift added  to L shoe.     Therapeutic Exercise: Aerobic:  Supine:  Bridging 2 x 10;   Prone hip ext x 10 bil;  S/L:   Hip abd 2 x 10 bil;  Seated:  Sit to stand x 10 for leg/quad strength;  Standing:  Step ups 6 in x 10 bil, for quad strength (inc leg pain at end of set)  Stretches:   LTR x 15;   Cat/cow x 15;  Neuromuscular Re-education: Manual Therapy: DTM R low lumbar , Lumbar PA mobs,  long leg distraction on R. Self Care: Modalities   12/05/2022 Therapeutic Exercise: Aerobic: Supine:  Bridging 2 x 10;  S/L:   Hip abd 2 x 10 on R;   Seated: Standing:  Hip abd and ext 2 x 10 ea bil;  Stretches: supine fig 4 piriformis x 2 ea bil;  Hip IR across body x 3 on R;   LTR x 15;  Neuromuscular Re-education: Manual Therapy: DTM R  glute min, med, piriformis,  long leg distraction on R. MET with R hip flexion;  Self Care: Modalities:  Moist heat pack at end of session to R glute, in S/L. X 10 min  Trigger Point Dry-Needling  Treatment instructions: Expect mild to moderate muscle  soreness. S/S of pneumothorax if dry needled over a lung field, and to seek immediate medical attention should they occur. Patient verbalized understanding of these instructions and education.    PATIENT EDUCATION:  Education details: updated and reviewed HEP Person educated: Patient Education method: Explanation, Demonstration, Tactile cues, Verbal cues, and Handouts Education comprehension: verbalized understanding, returned demonstration, verbal cues required, tactile cues required, and needs further education  HOME EXERCISE PROGRAM: Access Code: [email or web address]   ASSESSMENT:  CLINICAL IMPRESSION: 12/24/2022 Pt with soreness in L lumbar today, with manual. Symptom relief with distraction and opening of R side. Increased pain with attempts for loading of R side and side glides. Added heel lift to L shoe today, improved leg length and hip height in standing. Educated on use, wear time, and precautions with this. Will assess effects next visit.   Eval: Patient presents with primary complaint of pain in R low back. Pts pain consistent with SI pain, and has most pain with standing. She also has previous problems with R knee, now with mild pain, and knee staying in slight flexion in stance. Pt with decreased ability for full functional activities due to pain and deficit, and will benefit from skilled PT to improve.   OBJECTIVE IMPAIRMENTS: Abnormal gait, decreased activity tolerance, decreased mobility, decreased ROM, decreased strength, increased muscle spasms, improper body mechanics, and pain.   ACTIVITY LIMITATIONS: bending, standing, squatting, stairs, and locomotion level  PARTICIPATION LIMITATIONS: cleaning, laundry, shopping, community activity, and occupation  PERSONAL FACTORS: Time since onset of injury/illness/exacerbation are also affecting patient's functional outcome.   REHAB POTENTIAL: Good  CLINICAL DECISION MAKING: Stable/uncomplicated  EVALUATION COMPLEXITY:  Low   GOALS: Goals reviewed with patient? Yes  SHORT TERM GOALS: Target date: 12/05/2022    Pt to be independent with initial HEP  Goal status: INITIAL  2.  Pt to demo decreased muscle tension in R glute by at least 50 %  Goal status: INITIAL     LONG TERM GOALS: Target date: 01/16/2023    Pt to be independent with final HEP for ankle, knee and back pain  Goal status: INITIAL  2.  Pt to report decreased pain at R SI and in R LE to 0-2/10 to improve ability for  functional and community activity   Goal status: INITIAL  3.  Pt to tolerate at least 15 min of standing without pain greater than 2/10   Goal status: INITIAL    PLAN:  PT FREQUENCY: 1-2x/week  PT DURATION: 8 weeks  PLANNED INTERVENTIONS: Therapeutic exercises, Therapeutic activity, Neuromuscular re-education, Balance training, Gait training, Patient/Family education, Self Care, Joint mobilization, Joint manipulation, Stair training, Orthotic/Fit training, DME instructions, Dry Needling, Electrical stimulation, Spinal manipulation, Spinal mobilization, Cryotherapy, Moist heat, Taping, Traction, Ultrasound, Ionotophoresis 4mg /ml Dexamethasone, and Manual therapy.  PLAN FOR NEXT SESSION: manual for r lumbar, decompression, core strength,, possible dry needling,    Lyndee Hensen, PT, DPT 11:24 AM  12/24/22

## 2022-12-30 ENCOUNTER — Ambulatory Visit (INDEPENDENT_AMBULATORY_CARE_PROVIDER_SITE_OTHER): Payer: BC Managed Care – PPO | Admitting: Physical Therapy

## 2022-12-30 ENCOUNTER — Encounter: Payer: Self-pay | Admitting: Physical Therapy

## 2022-12-30 DIAGNOSIS — M5459 Other low back pain: Secondary | ICD-10-CM | POA: Diagnosis not present

## 2022-12-30 DIAGNOSIS — M25561 Pain in right knee: Secondary | ICD-10-CM

## 2022-12-30 DIAGNOSIS — G8929 Other chronic pain: Secondary | ICD-10-CM | POA: Diagnosis not present

## 2022-12-30 DIAGNOSIS — M25572 Pain in left ankle and joints of left foot: Secondary | ICD-10-CM

## 2022-12-30 NOTE — Therapy (Signed)
OUTPATIENT PHYSICAL THERAPY THORACOLUMBAR TREATMENT    Patient Name: Christine Webb MRN: [id number] DOB:05-30-1971, 52 y.o., female Today's Date: 12/30/2022  END OF SESSION:  PT End of Session - 12/30/22 1643     Visit Number 9    Number of Visits 16    Date for PT Re-Evaluation 01/16/23    Authorization Type [employer]BS    PT Start Time 1603    PT Stop Time [id number]    PT Time Calculation (min) 36 min    Activity Tolerance Patient tolerated treatment well;Other (comment)    Behavior During Therapy WFL for tasks assessed/performed               Past Medical History:  Diagnosis Date   Allergy    Arthritis    History of chicken pox    History of shingles    Hypertension    Iron deficiency anemia    Ablation; did require transfusions   Past Surgical History:  Procedure Laterality Date   CESAREAN SECTION  2002, 2004   CHOLECYSTECTOMY N/A 12/07/2020   Procedure: LAPAROSCOPIC CHOLECYSTECTOMY WITH INTRAOPERATIVE CHOLANGIOGRAM;  Surgeon: Armandina Gemma, MD;  Location: [facility];  Service: General;  Laterality: N/A;  ROOM 1 STARTING AT 07:30AM FOR 90 MIN   KNEE SURGERY  2012   3 surgeries   Patient Active Problem List   Diagnosis Date Noted   Chronic vertigo 11/01/2021   Hypertension     PCP: Inda Coke  REFERRING PROVIDER: Marylynn Pearson  REFERRING DIAG: Sciatica, right side  Rationale for Evaluation and Treatment: Rehabilitation  THERAPY DIAG:  Other low back pain  Pain in left ankle and joints of left foot  Chronic pain of right knee  ONSET DATE: 3 years ago  SUBJECTIVE:                                                                                                                                                                                           SUBJECTIVE STATEMENT: 12/30/2022 Pt states soreness in R knee. Has been wearing heel lift, with some "feeling" in R heel, but no pain. Still having pain in leg with only a few minutes of standing. Has MD appt with  sports med on Friday   Eval: Pt reports pain for about 3 years, Pain R low back, all the way down R LE. Started to get much worse in October. Standing is main problem and what brings on leg pain. Can only stand for a few minutes then pain stairs. States better with walking, movement, sitting. Can walk up to 2 mi/daily with hills and no pain in back.  Previous R knee  pain - Skiing accident with  fx in tibia, acl repair and knee scope More recently- medial pain with walking hills, had shot, has been ok. Labor day- sprained both ankles, L more severe, mild soreness at lateral ankle on L, feel stiff in AM.   Exercise: Walks , 2 mi/day, not doing strengthening at this time due to pain   Work: full time, sits at desk, has standing option.    Previous knee pain-  MRI from August 2023:   PERTINENT HISTORY: none  PAIN:  Are you having pain? Yes: NPRS scale: up to 5/10 Pain location: R SI, down R LE/posteriorly  Pain description: Sore Aggravating factors: standing  Relieving factors: movement  PRECAUTIONS: None  WEIGHT BEARING RESTRICTIONS: No  FALLS:  Has patient fallen in last 6 months? Yes. Number of falls 1 Night, friends house, missed steps.   PLOF: Independent  PATIENT GOALS: Decreased pain with standing    OBJECTIVE:   DIAGNOSTIC FINDINGS:    PATIENT SURVEYS:  Foto-   COGNITION: Overall cognitive status: Within functional limits for tasks assessed     SENSATION: WFL  POSTURE:  Standing: R knee in mild flexion,  feet: mostly neutral foot alignment, Hips appear even.   PALPATION: Supine: L LE shorter, R longer, long sit test negative.  Standing: R knee in slight flexion , back looks even.  Ankle: mild soreness at L lateral ankle Knee: no pain to palpate, did have pain at patella tendon at medial patella.  Back: pain at R SI. And surrounding glue muscle  LUMBAR ROM:   AROM eval  Flexion wfl  Extension wfl  Right lateral flexion   Left lateral flexion   Right  rotation   Left rotation    (Blank rows = not tested)  LOWER EXTREMITY ROM:     Hips: WFL,  Knees: WFL  LOWER EXTREMITY MMT:    MMT Right eval Left eval  Hip flexion 4+ 4+  Hip extension    Hip abduction 4+ 4+  Hip adduction    Hip internal rotation    Hip external rotation    Knee flexion 5 5  Knee extension 5 5  Ankle dorsiflexion    Ankle plantarflexion    Ankle inversion    Ankle eversion     (Blank rows = not tested)  LUMBAR SPECIAL TESTS:  Long sit test Neg, Painful SI. Painful stork on R.    TODAY'S TREATMENT:                                                                                                                              DATE:   12/30/2022 Therapeutic Exercise: Aerobic:  Supine:  Bridging 2 x 10;  SLR with TA 2 x 10 bil; Prone hip ext x 10 bil;  S/L:   Seated:  sit to stand/quad strength x 10;  Standing:  Step ups 4 in x 10 bil, no UE support.  Stretches: seated HSS  30 sec x 2 on R (mild pain)  reviewed standing R QL stretch ;  Neuromuscular Re-education: Manual Therapy: DTM R low lumbar and QL ;  long leg distraction on R. Self Care: Modalities:   Other :   Therapeutic Exercise: Aerobic:  Supine:  Bridging 2 x 10;   Prone hip ext x 10 bil;  S/L:   Hip abd 2 x 10 bil;  Seated:  Sit to stand x 10 for leg/quad strength;  Standing:  Step ups 6 in x 10 bil, for quad strength (inc leg pain at end of set)  Stretches:   LTR x 15;   Cat/cow x 15;  Neuromuscular Re-education: Manual Therapy: DTM R low lumbar , Lumbar PA mobs,  long leg distraction on R. Self Care: Modalities   12/05/2022 Therapeutic Exercise: Aerobic: Supine:  Bridging 2 x 10;  S/L:   Hip abd 2 x 10 on R;   Seated: Standing:  Hip abd and ext 2 x 10 ea bil;  Stretches: supine fig 4 piriformis x 2 ea bil;  Hip IR across body x 3 on R;   LTR x 15;  Neuromuscular Re-education: Manual Therapy: DTM R  glute min, med, piriformis,  long leg distraction on R. MET with R hip  flexion;  Self Care: Modalities:  Moist heat pack at end of session to R glute, in S/L. X 10 min  Trigger Point Dry-Needling  Treatment instructions: Expect mild to moderate muscle soreness. S/S of pneumothorax if dry needled over a lung field, and to seek immediate medical attention should they occur. Patient verbalized understanding of these instructions and education.    PATIENT EDUCATION:  Education details: updated and reviewed HEP Person educated: Patient Education method: Explanation, Demonstration, Tactile cues, Verbal cues, and Handouts Education comprehension: verbalized understanding, returned demonstration, verbal cues required, tactile cues required, and needs further education  HOME EXERCISE PROGRAM: Access Code: [email or web address]   ASSESSMENT:  CLINICAL IMPRESSION: 12/30/2022 Pt wearing heel lift with ok comfort. Will continue to assess soreness in knee and heel, and will remove if not helpful. Pain intensity less in LE, but still having onset with only a couple minutes of standing. Good tolerance for strength today, discussed importance of strength, and discussed pts gym program. Pt to see sports med on Friday for further assessment of pain.   Eval: Patient presents with primary complaint of pain in R low back. Pts pain consistent with SI pain, and has most pain with standing. She also has previous problems with R knee, now with mild pain, and knee staying in slight flexion in stance. Pt with decreased ability for full functional activities due to pain and deficit, and will benefit from skilled PT to improve.   OBJECTIVE IMPAIRMENTS: Abnormal gait, decreased activity tolerance, decreased mobility, decreased ROM, decreased strength, increased muscle spasms, improper body mechanics, and pain.   ACTIVITY LIMITATIONS: bending, standing, squatting, stairs, and locomotion level  PARTICIPATION LIMITATIONS: cleaning, laundry, shopping, community activity, and occupation  PERSONAL FACTORS:  Time since onset of injury/illness/exacerbation are also affecting patient's functional outcome.   REHAB POTENTIAL: Good  CLINICAL DECISION MAKING: Stable/uncomplicated  EVALUATION COMPLEXITY: Low   GOALS: Goals reviewed with patient? Yes  SHORT TERM GOALS: Target date: 12/05/2022    Pt to be independent with initial HEP  Goal status: INITIAL  2.  Pt to demo decreased muscle tension in R glute by at least 50 %  Goal status: INITIAL     LONG TERM GOALS: Target date: 01/16/2023  Pt to be independent with final HEP for ankle, knee and back pain  Goal status: INITIAL  2.  Pt to report decreased pain at R SI and in R LE to 0-2/10 to improve ability for functional and community activity   Goal status: INITIAL  3.  Pt to tolerate at least 15 min of standing without pain greater than 2/10   Goal status: INITIAL    PLAN:  PT FREQUENCY: 1-2x/week  PT DURATION: 8 weeks  PLANNED INTERVENTIONS: Therapeutic exercises, Therapeutic activity, Neuromuscular re-education, Balance training, Gait training, Patient/Family education, Self Care, Joint mobilization, Joint manipulation, Stair training, Orthotic/Fit training, DME instructions, Dry Needling, Electrical stimulation, Spinal manipulation, Spinal mobilization, Cryotherapy, Moist heat, Taping, Traction, Ultrasound, Ionotophoresis 4mg /ml Dexamethasone, and Manual therapy.  PLAN FOR NEXT SESSION: manual for r lumbar, decompression, core strength,, possible dry needling,    Lyndee Hensen, PT, DPT 4:45 PM  12/30/22

## 2023-01-01 ENCOUNTER — Ambulatory Visit (INDEPENDENT_AMBULATORY_CARE_PROVIDER_SITE_OTHER): Payer: BC Managed Care – PPO | Admitting: Physical Therapy

## 2023-01-01 ENCOUNTER — Telehealth: Payer: Self-pay | Admitting: Physician Assistant

## 2023-01-01 DIAGNOSIS — M25561 Pain in right knee: Secondary | ICD-10-CM | POA: Diagnosis not present

## 2023-01-01 DIAGNOSIS — M25572 Pain in left ankle and joints of left foot: Secondary | ICD-10-CM

## 2023-01-01 DIAGNOSIS — M5459 Other low back pain: Secondary | ICD-10-CM

## 2023-01-01 DIAGNOSIS — G8929 Other chronic pain: Secondary | ICD-10-CM

## 2023-01-01 NOTE — Telephone Encounter (Signed)
  Encourage patient to contact the pharmacy for refills or they can request refills through [facility]:  11/07/2022  MEDICATION:tirzepatide (ZEPBOUND)    Is the patient out of medication?   PHARMACY: [id number] - [place], [place] - [address] Phone: [phone]  Fax: [phone]      Let patient know to contact pharmacy at the end of the day to make sure medication is ready.  Please notify patient to allow 48-72 hours to process

## 2023-01-01 NOTE — Progress Notes (Signed)
Christine PayorI, Christine Collins, PhD, LAT, ATC acting as a scribe for Christine GrahamEvan Milady Fleener, MD.  Subjective:    CC: low back pain  HPI: Pt is a 52 y/o female c/o LBP ongoing and worsening over the last 9-10 months. She is currently doing PT at [facility], and has completed 9 visits. Pt locates pain to SI and gluteal area.  Pain has continued despite physical therapy initially started on November 21, 2022. Some improvement with PT, sx have some to a stand still. Sx started to flare when standing for longer then 1 minute, then begins to radiate into the right leg to the calf. Has lift in left shoe for leg length discrepancy.  Pain is worse with extension and better with lumbar flexion.   Additionally she notes right knee pain.  Pain has been ongoing for years worsening recently with increased activity level.  She has been seen by Dr. August Saucerean for this previously and had a steroid injection around August 2023.  This worked until recently.  Radiating pain: yes LE numbness/tingling: yes LE weakness: yes, unrelated per pt. Aggravates: prolonged standing Treatments tried: IBU, PT x 9 visits,   No recent diagnostic imaging of the back.   Pertinent review of Systems: no fever or chills  Relevant historical information: HTN History of prior knee surgery including a knee fracture and an ACL reconstruction about 12 years ago.  Objective:    Vitals:   01/02/23 0809  BP: 122/84  Pulse: 81  SpO2: 96%   General: Well Developed, well nourished, and in no acute distress.   MSK: Lspine: Normal appearing.  Nontender midline Lspine.  Normal flexion ROM limited extension ROM. Normal lateral flexion and rotation.  Lower extremity strength is intact and normal.  Reflex normal Sensation intact distally.  Right knee: Mature scar along the anterior lateral aspect of the knee.  Normal knee motion. Tender palpation medial and lateral joint line. Some laxity to MCL stress test.  No ACL anterior drawer test  laxity.. Intact strength.  Lab and Radiology Results  Procedure: Real-time Ultrasound Guided Injection of right knee joint superior lateral patellar space Device: Philips Affiniti 50G Images permanently stored and available for review in PACS Verbal informed consent obtained.  Discussed risks and benefits of procedure. Warned about infection, bleeding, hyperglycemia damage to structures among others. Patient expresses understanding and agreement Time-out conducted.   Noted no overlying erythema, induration, or other signs of local infection.   Skin prepped in a sterile fashion.   Local anesthesia: Topical Ethyl chloride.   With sterile technique and under real time ultrasound guidance: 40 mg of Kenalog and 2 mL Marcaine injected into knee joint. Fluid seen entering the joint capsule.   Completed without difficulty   Pain immediately resolved suggesting accurate placement of the medication.   Advised to call if fevers/chills, erythema, induration, drainage, or persistent bleeding.   Images permanently stored and available for review in the ultrasound unit.  Impression: Technically successful ultrasound guided injection.   X-ray images lumbar spine obtained today personally and independently interpreted No acute fractures.  Mild degenerative changes. Await formal radiology review     Impression and Recommendations:    Assessment and Plan: 52 y.o. female with chronic low back pain with lumbar radiculopathy consistent with right L5.  This is an ongoing issue that has failed to improve with dedicated physical therapy for the last 6 weeks.  At this point plan to proceed to advanced imaging including lumbar spine MRI for epidural steroid  injection planning.  Will try to treat initially right now with prednisone and gabapentin.  Short course of prednisone and gabapentin primarily at bedtime.  Additionally she has knee pain thought to be due to exacerbation of underlying DJD.  This was seen  previously on right knee MRI ordered by Dr. August Saucerean in 2023.  Plan for conservative management until ultimately she will require knee replacement.  We talked about the potential for medial off loader knee brace.  She does have some sort of hinged knee brace from 12 years ago that may be off loader brace or may not be.  She will send a picture so I can consult with my [facility] colleagues.  PDMP not reviewed this encounter. Orders Placed This Encounter  Procedures   DG Lumbar Spine 2-3 Views    Standing Status:   Future    Number of Occurrences:   1    Standing Expiration Date:   02/01/2023    Order Specific Question:   Reason for Exam (SYMPTOM  OR DIAGNOSIS REQUIRED)    Answer:   low back pain radiating into the right leg    Order Specific Question:   Preferred imaging location?    Answer:   [facility]    Order Specific Question:   Is patient pregnant?    Answer:   No   MR LUMBAR SPINE WO CONTRAST    Standing Status:   Future    Standing Expiration Date:   02/01/2023    Order Specific Question:   What is the patient's sedation requirement?    Answer:   No Sedation    Order Specific Question:   Does the patient have a pacemaker or implanted devices?    Answer:   No    Order Specific Question:   Preferred imaging location?    Answer:   [facility]  (table limit-350lbs)   US LIMITED JOINT SPACE STRUCTURES LOW RIGHT(NO LINKED CHARGES)    Order Specific Question:   Reason for Exam (SYMPTOM  OR DIAGNOSIS REQUIRED)    Answer:   eval knee    Order Specific Question:   Preferred imaging location?    Answer:   [facility]   Meds ordered this encounter  Medications   gabapentin (NEURONTIN) 100 MG capsule    Sig: Take 1 capsule (100 mg total) by mouth 3 (three) times daily. 100-300mg  TID prn    Dispense:  30 capsule    Refill:  3   predniSONE (DELTASONE) 50 MG tablet    Sig: Take 1 pill daily for 5 days    Dispense:  5 tablet    Refill:  0    Discussed  warning signs or symptoms. Please see discharge instructions. Patient expresses understanding.   The above documentation has been reviewed and is accurate and complete Christine GrahamEvan Calhoun Reichardt, M.D.

## 2023-01-01 NOTE — Therapy (Addendum)
OUTPATIENT PHYSICAL THERAPY THORACOLUMBAR TREATMENT    Patient Name: Christine Webb MRN: [id number] DOB:12-Jun-1971, 52 y.o., female Today's Date: 01/01/2023  END OF SESSION:  PT End of Session - 01/01/23 2056     Visit Number 10    Number of Visits 16    Date for PT Re-Evaluation 01/16/23    Authorization Type BCBS    PT Start Time 1604    PT Stop Time 1645    PT Time Calculation (min) 41 min    Activity Tolerance Patient tolerated treatment well;Other (comment)    Behavior During Therapy WFL for tasks assessed/performed                Past Medical History:  Diagnosis Date   Allergy    Arthritis    History of chicken pox    History of shingles    Hypertension    Iron deficiency anemia    Ablation; did require transfusions   Past Surgical History:  Procedure Laterality Date   CESAREAN SECTION  2002, 2004   CHOLECYSTECTOMY N/A 12/07/2020   Procedure: LAPAROSCOPIC CHOLECYSTECTOMY WITH INTRAOPERATIVE CHOLANGIOGRAM;  Surgeon: Darnell Level, MD;  Location: [facility];  Service: General;  Laterality: N/A;  ROOM 1 STARTING AT 07:30AM FOR 90 MIN   KNEE SURGERY  2012   3 surgeries   Patient Active Problem List   Diagnosis Date Noted   Chronic vertigo 11/01/2021   Hypertension     PCP: Jarold Motto  REFERRING PROVIDER: Zelphia Cairo  REFERRING DIAG: Sciatica, right side  Rationale for Evaluation and Treatment: Rehabilitation  THERAPY DIAG:  Other low back pain  Pain in left ankle and joints of left foot  Chronic pain of right knee  ONSET DATE: 3 years ago  SUBJECTIVE:                                                                                                                                                                                           SUBJECTIVE STATEMENT: 01/01/2023 Pt states continued soreness in SI/glute area with standing.   Eval: Pt reports pain for about 3 years, Pain R low back, all the way down R LE. Started to get much worse in  October. Standing is main problem and what brings on leg pain. Can only stand for a few minutes then pain stairs. States better with walking, movement, sitting. Can walk up to 2 mi/daily with hills and no pain in back.  Previous R knee pain - Skiing accident with  fx in tibia, acl repair and knee scope More recently- medial pain with walking hills, had shot, has been ok. Labor day- sprained both  ankles, L more severe, mild soreness at lateral ankle on L, feel stiff in AM.   Exercise: Walks , 2 mi/day, not doing strengthening at this time due to pain   Work: full time, sits at desk, has standing option.    Previous knee pain-  MRI from August 2023:   PERTINENT HISTORY: none  PAIN:  Are you having pain? Yes: NPRS scale: up to 5/10 Pain location: R SI, down R LE/posteriorly  Pain description: Sore Aggravating factors: standing  Relieving factors: movement  PRECAUTIONS: None  WEIGHT BEARING RESTRICTIONS: No  FALLS:  Has patient fallen in last 6 months? Yes. Number of falls 1 Night, friends house, missed steps.   PLOF: Independent  PATIENT GOALS: Decreased pain with standing    OBJECTIVE:   DIAGNOSTIC FINDINGS:    PATIENT SURVEYS:  Foto-   COGNITION: Overall cognitive status: Within functional limits for tasks assessed     SENSATION: WFL  POSTURE:  Standing: R knee in mild flexion,  feet: mostly neutral foot alignment, Hips appear even.   PALPATION: Supine: L LE shorter, R longer, long sit test negative.  Standing: R knee in slight flexion , back looks even.  Ankle: mild soreness at L lateral ankle Knee: no pain to palpate, did have pain at patella tendon at medial patella.  Back: pain at R SI. And surrounding glue muscle  LUMBAR ROM:   AROM eval  Flexion wfl  Extension wfl  Right lateral flexion   Left lateral flexion   Right rotation   Left rotation    (Blank rows = not tested)  LOWER EXTREMITY ROM:     Hips: WFL,  Knees: WFL  LOWER EXTREMITY MMT:     MMT Right eval Left eval  Hip flexion 4+ 4+  Hip extension    Hip abduction 4+ 4+  Hip adduction    Hip internal rotation    Hip external rotation    Knee flexion 5 5  Knee extension 5 5  Ankle dorsiflexion    Ankle plantarflexion    Ankle inversion    Ankle eversion     (Blank rows = not tested)  LUMBAR SPECIAL TESTS:  Long sit test Neg, Painful SI. Painful stork on R.    TODAY'S TREATMENT:                                                                                                                              DATE:   01/01/2023 Therapeutic Exercise: Aerobic: Bike L x 5 min;  Supine:  Bridging 2 x 10;   Prone hip ext x 10 bil;  S/L:   Seated:  sit to stand/quad strength x 10;  Standing:  Hip abd and ext 2 x 10 bil;  Stretches: seated piriformis stretch 30 sec x 3 bil;  standing R QL stretch ;  Neuromuscular Re-education: Manual Therapy: DTM /TPR to R piriformis and sacral border ;  long leg distraction on R. Self  Care:    Therapeutic Exercise: Aerobic:  Supine:  Bridging 2 x 10;   Prone hip ext x 10 bil;  S/L:   Hip abd 2 x 10 bil;  Seated:  Sit to stand x 10 for leg/quad strength;  Standing:  Step ups 6 in x 10 bil, for quad strength (inc leg pain at end of set)  Stretches:   LTR x 15;   Cat/cow x 15;  Neuromuscular Re-education: Manual Therapy: DTM R low lumbar , Lumbar PA mobs,  long leg distraction on R. Self Care: Modalities   12/05/2022 Therapeutic Exercise: Aerobic: Supine:  Bridging 2 x 10;  S/L:   Hip abd 2 x 10 on R;   Seated: Standing:  Hip abd and ext 2 x 10 ea bil;  Stretches: supine fig 4 piriformis x 2 ea bil;  Hip IR across body x 3 on R;   LTR x 15;  Neuromuscular Re-education: Manual Therapy: DTM R  glute min, med, piriformis,  long leg distraction on R. MET with R hip flexion;  Self Care: Modalities:  Moist heat pack at end of session to R glute, in S/L. X 10 min  Trigger Point Dry-Needling  Treatment instructions: Expect mild  to moderate muscle soreness. S/S of pneumothorax if dry needled over a lung field, and to seek immediate medical attention should they occur. Patient verbalized understanding of these instructions and education.    PATIENT EDUCATION:  Education details: updated and reviewed HEP Person educated: Patient Education method: Explanation, Demonstration, Tactile cues, Verbal cues, and Handouts Education comprehension: verbalized understanding, returned demonstration, verbal cues required, tactile cues required, and needs further education  HOME EXERCISE PROGRAM: Access Code: [email or web address]   ASSESSMENT:  CLINICAL IMPRESSION: 01/01/2023 Pain intensity less in LE, but still having onset of SI, glute and pain into calf ,  with only a couple minutes of standing. Manual done today for tenderness along sacral border and in piriformis. Discussed possible pelvic manipulation, but will have appt with sports med first.   Eval: Patient presents with primary complaint of pain in R low back. Pts pain consistent with SI pain, and has most pain with standing. She also has previous problems with R knee, now with mild pain, and knee staying in slight flexion in stance. Pt with decreased ability for full functional activities due to pain and deficit, and will benefit from skilled PT to improve.   OBJECTIVE IMPAIRMENTS: Abnormal gait, decreased activity tolerance, decreased mobility, decreased ROM, decreased strength, increased muscle spasms, improper body mechanics, and pain.   ACTIVITY LIMITATIONS: bending, standing, squatting, stairs, and locomotion level  PARTICIPATION LIMITATIONS: cleaning, laundry, shopping, community activity, and occupation  PERSONAL FACTORS: Time since onset of injury/illness/exacerbation are also affecting patient's functional outcome.   REHAB POTENTIAL: Good  CLINICAL DECISION MAKING: Stable/uncomplicated  EVALUATION COMPLEXITY: Low   GOALS: Goals reviewed with patient? Yes  SHORT  TERM GOALS: Target date: 12/05/2022    Pt to be independent with initial HEP  Goal status: INITIAL  2.  Pt to demo decreased muscle tension in R glute by at least 50 %  Goal status: INITIAL     LONG TERM GOALS: Target date: 01/16/2023    Pt to be independent with final HEP for ankle, knee and back pain  Goal status: INITIAL  2.  Pt to report decreased pain at R SI and in R LE to 0-2/10 to improve ability for functional and community activity   Goal status: INITIAL  3.  Pt to  tolerate at least 15 min of standing without pain greater than 2/10   Goal status: INITIAL    PLAN:  PT FREQUENCY: 1-2x/week  PT DURATION: 8 weeks  PLANNED INTERVENTIONS: Therapeutic exercises, Therapeutic activity, Neuromuscular re-education, Balance training, Gait training, Patient/Family education, Self Care, Joint mobilization, Joint manipulation, Stair training, Orthotic/Fit training, DME instructions, Dry Needling, Electrical stimulation, Spinal manipulation, Spinal mobilization, Cryotherapy, Moist heat, Taping, Traction, Ultrasound, Ionotophoresis 4mg /ml Dexamethasone, and Manual therapy.  PLAN FOR NEXT SESSION:   Sedalia Muta, PT, DPT 8:57 PM  01/01/23  PHYSICAL THERAPY DISCHARGE SUMMARY  Visits from Start of Care: 10   Plan: Patient agrees to discharge.  Patient goals were partially met. Patient is being discharged due to- pt did not return after last visit, went to f/u with MD.    Sedalia Muta, PT, DPT 3:20 PM  04/30/23

## 2023-01-02 ENCOUNTER — Ambulatory Visit (INDEPENDENT_AMBULATORY_CARE_PROVIDER_SITE_OTHER): Payer: BC Managed Care – PPO

## 2023-01-02 ENCOUNTER — Ambulatory Visit (INDEPENDENT_AMBULATORY_CARE_PROVIDER_SITE_OTHER): Payer: BC Managed Care – PPO | Admitting: Family Medicine

## 2023-01-02 ENCOUNTER — Encounter: Payer: Self-pay | Admitting: Family Medicine

## 2023-01-02 ENCOUNTER — Other Ambulatory Visit: Payer: Self-pay

## 2023-01-02 VITALS — BP 122/84 | HR 81 | Ht 63.5 in | Wt 210.0 lb

## 2023-01-02 DIAGNOSIS — G8929 Other chronic pain: Secondary | ICD-10-CM | POA: Diagnosis not present

## 2023-01-02 DIAGNOSIS — M545 Low back pain, unspecified: Secondary | ICD-10-CM | POA: Diagnosis not present

## 2023-01-02 DIAGNOSIS — M5441 Lumbago with sciatica, right side: Secondary | ICD-10-CM

## 2023-01-02 DIAGNOSIS — M5416 Radiculopathy, lumbar region: Secondary | ICD-10-CM

## 2023-01-02 DIAGNOSIS — M25561 Pain in right knee: Secondary | ICD-10-CM

## 2023-01-02 MED ORDER — PREDNISONE 50 MG PO TABS: ORAL_TABLET | ORAL | 0 refills | Status: DC

## 2023-01-02 MED ORDER — GABAPENTIN 100 MG PO CAPS: 100.0000 mg | ORAL_CAPSULE | Freq: Three times a day (TID) | ORAL | 3 refills | Status: DC

## 2023-01-02 NOTE — Patient Instructions (Addendum)
Thank you for coming in today.   You received an injection today. Seek immediate medical attention if the joint becomes red, extremely painful, or is oozing fluid.   You should hear from MRI scheduling within 1 week. If you do not hear please let me know.    Check back after MRI 

## 2023-01-02 NOTE — Telephone Encounter (Signed)
Left message on voicemail to call office. Need to know if she wants to go up to next dose of Zepbound 7.5 mg?

## 2023-01-05 ENCOUNTER — Encounter: Payer: BC Managed Care – PPO | Admitting: Physical Therapy

## 2023-01-05 MED ORDER — ZEPBOUND 7.5 MG/0.5ML ~~LOC~~ SOAJ: 7.5000 mg | SUBCUTANEOUS | 0 refills | Status: DC

## 2023-01-05 NOTE — Telephone Encounter (Signed)
Rx for Zepbound 7.5 mg to the [facility].

## 2023-01-05 NOTE — Telephone Encounter (Signed)
Patient returned call. I asked patient question listed below and she stated she would like to increase.

## 2023-01-05 NOTE — Progress Notes (Signed)
Lumbar spine x-ray looks normal to radiology.

## 2023-01-08 ENCOUNTER — Telehealth: Payer: Self-pay | Admitting: Physician Assistant

## 2023-01-08 ENCOUNTER — Other Ambulatory Visit: Payer: Self-pay

## 2023-01-08 MED ORDER — ZEPBOUND 5 MG/0.5ML ~~LOC~~ SOAJ: 5.0000 mg | SUBCUTANEOUS | 0 refills | Status: DC

## 2023-01-08 NOTE — Telephone Encounter (Signed)
Patient states [facility] is unable to get 7.5 Zepbound.  States they have 1 Zepbound 5MG  left.  Requests RX for Zepbound 5MG  be sent to  [id number] - [place], [place] - [address] AT [address] Phone: [phone]  Fax: [phone]

## 2023-01-08 NOTE — Telephone Encounter (Signed)
Zepbound 5 mg sent to [facility] [place]. LMOVM advising patient that medication has been sent in.

## 2023-01-09 ENCOUNTER — Encounter: Payer: BC Managed Care – PPO | Admitting: Physical Therapy

## 2023-01-10 ENCOUNTER — Ambulatory Visit (INDEPENDENT_AMBULATORY_CARE_PROVIDER_SITE_OTHER): Payer: BC Managed Care – PPO

## 2023-01-10 DIAGNOSIS — M47816 Spondylosis without myelopathy or radiculopathy, lumbar region: Secondary | ICD-10-CM | POA: Diagnosis not present

## 2023-01-10 DIAGNOSIS — G8929 Other chronic pain: Secondary | ICD-10-CM | POA: Diagnosis not present

## 2023-01-10 DIAGNOSIS — M5416 Radiculopathy, lumbar region: Secondary | ICD-10-CM | POA: Diagnosis not present

## 2023-01-10 DIAGNOSIS — M5441 Lumbago with sciatica, right side: Secondary | ICD-10-CM | POA: Diagnosis not present

## 2023-01-10 DIAGNOSIS — M48061 Spinal stenosis, lumbar region without neurogenic claudication: Secondary | ICD-10-CM | POA: Diagnosis not present

## 2023-01-14 ENCOUNTER — Telehealth: Payer: Self-pay | Admitting: Family Medicine

## 2023-01-14 DIAGNOSIS — M5416 Radiculopathy, lumbar region: Secondary | ICD-10-CM

## 2023-01-14 NOTE — Progress Notes (Signed)
Lumbar spine MRI shows potential for the L5 nerve to be pinched causing pain down your leg.  Your MRI corresponds to your pain.  I have ordered an epidural steroid injection of your lumbar spine which should help this significantly.  You should hear from [facility] about scheduling.  Recommend you check back with me following the injection.  We can talk about the injection and the MRI if full detail then.

## 2023-01-14 NOTE — Telephone Encounter (Signed)
Epidural steroid injection ordered 

## 2023-02-20 ENCOUNTER — Other Ambulatory Visit: Payer: Self-pay | Admitting: Physician Assistant

## 2023-02-23 MED ORDER — ZEPBOUND 5 MG/0.5ML ~~LOC~~ SOAJ: 5.0000 mg | SUBCUTANEOUS | 0 refills | Status: DC

## 2023-03-12 ENCOUNTER — Ambulatory Visit
Admission: RE | Admit: 2023-03-12 | Discharge: 2023-03-12 | Disposition: A | Discharge status: Home / Self Care | Payer: BC Managed Care – PPO | Source: Ambulatory Visit | Attending: Family Medicine | Admitting: Family Medicine

## 2023-03-12 ENCOUNTER — Other Ambulatory Visit: Payer: BC Managed Care – PPO

## 2023-03-12 DIAGNOSIS — M5416 Radiculopathy, lumbar region: Secondary | ICD-10-CM

## 2023-03-12 DIAGNOSIS — M4727 Other spondylosis with radiculopathy, lumbosacral region: Secondary | ICD-10-CM | POA: Diagnosis not present

## 2023-03-12 MED ORDER — METHYLPREDNISOLONE ACETATE 40 MG/ML INJ SUSP (RADIOLOG
80.0000 mg | Freq: Once | INTRAMUSCULAR | Status: AC
  Administered 2023-03-12: 80 mg via EPIDURAL

## 2023-03-12 MED ORDER — IOPAMIDOL (ISOVUE-M 200) INJECTION 41%
1.0000 mL | Freq: Once | INTRAMUSCULAR | Status: AC
  Administered 2023-03-12: 1 mL via EPIDURAL

## 2023-03-12 NOTE — Discharge Instructions (Signed)

## 2023-03-13 ENCOUNTER — Other Ambulatory Visit: Payer: Self-pay | Admitting: Physician Assistant

## 2023-03-16 ENCOUNTER — Other Ambulatory Visit: Payer: Self-pay | Admitting: Physician Assistant

## 2023-03-16 MED ORDER — ZEPBOUND 10 MG/0.5ML ~~LOC~~ SOAJ: 10.0000 mg | SUBCUTANEOUS | 0 refills | Status: DC

## 2023-04-20 ENCOUNTER — Other Ambulatory Visit (HOSPITAL_BASED_OUTPATIENT_CLINIC_OR_DEPARTMENT_OTHER): Payer: Self-pay

## 2023-04-20 ENCOUNTER — Ambulatory Visit (INDEPENDENT_AMBULATORY_CARE_PROVIDER_SITE_OTHER): Payer: BC Managed Care – PPO | Admitting: Physician Assistant

## 2023-04-20 VITALS — BP 110/80 | HR 84 | Temp 97.3°F | Ht 63.5 in | Wt 206.5 lb

## 2023-04-20 DIAGNOSIS — R1011 Right upper quadrant pain: Secondary | ICD-10-CM

## 2023-04-20 DIAGNOSIS — I10 Essential (primary) hypertension: Secondary | ICD-10-CM

## 2023-04-20 DIAGNOSIS — R5383 Other fatigue: Secondary | ICD-10-CM | POA: Diagnosis not present

## 2023-04-20 DIAGNOSIS — Z6836 Body mass index (BMI) 36.0-36.9, adult: Secondary | ICD-10-CM

## 2023-04-20 DIAGNOSIS — R009 Unspecified abnormalities of heart beat: Secondary | ICD-10-CM | POA: Diagnosis not present

## 2023-04-20 DIAGNOSIS — E669 Obesity, unspecified: Secondary | ICD-10-CM

## 2023-04-20 MED ORDER — ZEPBOUND 10 MG/0.5ML ~~LOC~~ SOAJ
10.0000 mg | SUBCUTANEOUS | 0 refills | Status: DC
  Filled 2023-04-20: qty 2, 28d supply, fill #0

## 2023-04-20 NOTE — Patient Instructions (Addendum)
It was great to see you!  To treat your constipation today: -Take 100 mg of docusate sodium (also known as Colace, however generic is fine!) by mouth twice daily to soften stools -Drink at least 64 oz of water daily -Add in 1 capful of polyethylene glycol (also known as Miralax, however generic is fine!) to beverage of choice daily  Goal is to have a formed, soft bowel movement regularly   -After a few days if no success, may increase to a total of 2 capfuls of polyethylene glycol/ Miralax daily  If still no results, please call the office   If still no improvement in your abdominal pain, let me know and we will order CT abdomen/pelvis  Please reach out to gastroenterology about scheduling your colonoscopy  Take care,  Jarold Motto PA-C

## 2023-04-20 NOTE — Progress Notes (Signed)
Christine Webb is a 52 y.o. female here for a new problem.  History of Present Illness:   Chief Complaint  Patient presents with   Abdominal Pain    Pt c/o right upper quadrant pain has been going on for months, is getting worse. Has not taken anything for pain.     Fatigue    Pt c/o feeling exhausted the past 2 months.   Right abdominal pain:  She complains of on and off sore and/or burning pain in her right upper abdomen for the past couple years. She describes her pain has a cigarette burn.  She also feels like her sides burst when bending down to pick something up or laughing.  She had right upper quadrant of abdomen in March 2023 for this issue without known cause of symptom(s); she is status post cholecystectomy She has constipation due to taking zepbound and is drinking mushroom coffee, eating more fiber, and drinking more water to manage her symptoms.  Denies fevers, chills, vomiting.  Obesity She reports food noise has improved since she started taking Zepbound. She also developed nausea, decreased appetite, and constipation since she started taking it.  Her nausea has increased the longer she's taken it.  She also found her heart rate increases 2-3x weekly when taking Zepbound.  The benefits of the medication outweighs the cons and she prefers to stay on Zepbound.   Fatigue She gets 7-8 hours of uninterrupted sleep every night.  She has no changes in her diet.  She had increased stress last month due to travel but it was nothing overwhelming for her.  She has a mammogram schedule later this year.  She is due for colonoscopy this year and is planning on scheduling an appointment.  Her menstrual cycles are irregular.  She typically has a menstrual cycle for one day of the month and not have one for 3 months.   HTN She continues taking 40 mg lisinopril daily PO, 12.5 mg hydrochlorothiazide daily PO and reports no new issues while taking them.  BP Readings from Last 3  Encounters:  04/20/23 110/80  03/12/23 129/83  01/02/23 122/84   Pulse Readings from Last 3 Encounters:  04/20/23 84  03/12/23 70  01/02/23 81      Social history:  She drinks one mug of coffee daily.  She rarely drinks alcohol.    Past Medical History:  Diagnosis Date   Allergy    Arthritis    History of chicken pox    History of shingles    Hypertension    Iron deficiency anemia    Ablation; did require transfusions     Social History   Tobacco Use   Smoking status: Never   Smokeless tobacco: Never  Vaping Use   Vaping status: Never Used  Substance Use Topics   Alcohol use: Yes    Alcohol/week: 3.0 standard drinks of alcohol    Types: 3 Glasses of wine per week    Comment: weekends only   Drug use: Never    Past Surgical History:  Procedure Laterality Date   CESAREAN SECTION  2002, 2004   CHOLECYSTECTOMY N/A 12/07/2020   Procedure: LAPAROSCOPIC CHOLECYSTECTOMY WITH INTRAOPERATIVE CHOLANGIOGRAM;  Surgeon: Darnell Level, MD;  Location: [facility];  Service: General;  Laterality: N/A;  ROOM 1 STARTING AT 07:30AM FOR 90 MIN   KNEE SURGERY  2012   3 surgeries    Family History  Problem Relation Age of Onset   Hypertension Mother    Breast cancer  Mother    Colon cancer Mother    Congestive Heart Failure Mother    Osteoarthritis Father    Hypertension Father    Skin cancer Brother    Lung cancer Maternal Grandmother    Kidney cancer Paternal Grandmother     Allergies  Allergen Reactions   Morphine And Codeine     Pt preference - makes pt really itchy     Current Medications:   Current Outpatient Medications:    atorvastatin (LIPITOR) 20 MG tablet, TAKE 1 TABLET BY MOUTH EVERY DAY, Disp: 90 tablet, Rfl: 1   cetirizine (ZYRTEC) 10 MG tablet, Take 10 mg by mouth daily as needed for allergies., Disp: , Rfl:    hydrochlorothiazide (MICROZIDE) 12.5 MG capsule, TAKE 1 CAPSULE BY MOUTH EVERY DAY, Disp: 90 capsule, Rfl: 1   lisinopril (ZESTRIL) 40 MG tablet,  TAKE 1 TABLET BY MOUTH EVERY DAY, Disp: 90 tablet, Rfl: 1   meclizine (ANTIVERT) 12.5 MG tablet, Take 1 tablet (12.5 mg total) by mouth 3 (three) times daily as needed for dizziness., Disp: 30 tablet, Rfl: 0   tirzepatide (ZEPBOUND) 10 MG/0.5ML Pen, Inject 10 mg into the skin once a week., Disp: 2 mL, Rfl: 0   Review of Systems:   Review of Systems  Gastrointestinal:  Positive for abdominal pain (burning and/or sore upper right abdominal pain) and constipation. Negative for nausea.    Vitals:   Vitals:   04/20/23 1456  BP: 110/80  Pulse: 84  Temp: (!) 97.3 F (36.3 C)  TempSrc: Temporal  SpO2: 97%  Weight: 206 lb 8 oz (93.7 kg)  Height: 5' 3.5" (1.613 m)     Body mass index is 36.01 kg/m.  Physical Exam:   Physical Exam Vitals and nursing note reviewed.  Constitutional:      General: She is not in acute distress.    Appearance: She is well-developed. She is not ill-appearing or toxic-appearing.  Cardiovascular:     Rate and Rhythm: Normal rate and regular rhythm.     Pulses: Normal pulses.     Heart sounds: Normal heart sounds, S1 normal and S2 normal.  Pulmonary:     Effort: Pulmonary effort is normal.     Breath sounds: Normal breath sounds.  Abdominal:     General: Abdomen is flat. Bowel sounds are normal.     Palpations: Abdomen is soft.     Tenderness: There is generalized abdominal tenderness. There is no right CVA tenderness, left CVA tenderness, guarding or rebound.  Skin:    General: Skin is warm and dry.  Neurological:     Mental Status: She is alert.     GCS: GCS eye subscore is 4. GCS verbal subscore is 5. GCS motor subscore is 6.  Psychiatric:        Speech: Speech normal.        Behavior: Behavior normal. Behavior is cooperative.     Assessment and Plan:   Other fatigue Update blood work today Recommend reaching out to gastroenterology schedule colonoscopy Continue healthy lifestyle efforts Follow-up if symptom(s) persist  RUQ pain No red  flags or evidence of acute abdomen Recommend miralax daily to help improve constipation If symptom(s) persist, will obtain CT abdomen/pelvis  Abnormal heart rate Unclear etiology No chest pain, shortness of breath or other concerning symptom(s) accompanying this Will order zio patch to monitor this Consider cardiology evaluation if ongoing If new/worsening symptom(s), advised her to reach out to [facility]  Obesity, unspecified classification, unspecified obesity type, unspecified  whether serious comorbidity present Continue Zepbound -- will send to [facility] to get more consistent supply Follow-up in 3 months, sooner if concerns If constipation and other side effects worsen, reach out for lower dose or advice on next steps  Primary hypertension Normotensive Denies signs of hypotension Continue current regimen of 40 mg lisinopril daily PO, 12.5 mg hydrochlorothiazide daily Keep an eye on numbers and follow-up if new/worsening symptom(s) or readings  I,Shehryar Baig,acting as a scribe for [employer], PA.,have documented all relevant documentation on the behalf of Jarold Motto, PA,as directed by  Jarold Motto, PA while in the presence of Jarold Motto, [place].  I, Jarold Motto, [place], have reviewed all documentation for this visit. The documentation on 04/20/23 for the exam, diagnosis, procedures, and orders are all accurate and complete.  Jarold Motto, PA-C

## 2023-04-21 ENCOUNTER — Other Ambulatory Visit: Payer: Self-pay | Admitting: Physician Assistant

## 2023-04-21 ENCOUNTER — Ambulatory Visit: Payer: BC Managed Care – PPO | Attending: Physician Assistant

## 2023-04-21 ENCOUNTER — Telehealth: Payer: Self-pay | Admitting: *Deleted

## 2023-04-21 ENCOUNTER — Ambulatory Visit: Payer: BC Managed Care – PPO

## 2023-04-21 DIAGNOSIS — R009 Unspecified abnormalities of heart beat: Secondary | ICD-10-CM

## 2023-04-21 DIAGNOSIS — I2510 Atherosclerotic heart disease of native coronary artery without angina pectoris: Secondary | ICD-10-CM

## 2023-04-21 DIAGNOSIS — R1011 Right upper quadrant pain: Secondary | ICD-10-CM

## 2023-04-21 DIAGNOSIS — E669 Obesity, unspecified: Secondary | ICD-10-CM

## 2023-04-21 DIAGNOSIS — R5383 Other fatigue: Secondary | ICD-10-CM

## 2023-04-21 DIAGNOSIS — I10 Essential (primary) hypertension: Secondary | ICD-10-CM

## 2023-04-21 DIAGNOSIS — R17 Unspecified jaundice: Secondary | ICD-10-CM

## 2023-04-21 LAB — IBC + FERRITIN
Ferritin: 127.3 ng/mL (ref 10.0–291.0)
Iron: 83 ug/dL (ref 42–145)
Saturation Ratios: 25 % (ref 20.0–50.0)
TIBC: 331.8 ug/dL (ref 250.0–450.0)
Transferrin: 237 mg/dL (ref 212.0–360.0)

## 2023-04-21 LAB — COMPREHENSIVE METABOLIC PANEL
ALT: 15 U/L (ref 0–35)
AST: 17 U/L (ref 0–37)
Albumin: 4.8 g/dL (ref 3.5–5.2)
Alkaline Phosphatase: 56 U/L (ref 39–117)
BUN: 10 mg/dL (ref 6–23)
CO2: 25 mEq/L (ref 19–32)
Calcium: 9.8 mg/dL (ref 8.4–10.5)
Chloride: 101 mEq/L (ref 96–112)
Creatinine, Ser: 0.81 mg/dL (ref 0.40–1.20)
GFR: 83.44 mL/min (ref >60.00)
Glucose, Bld: 82 mg/dL (ref 70–99)
Potassium: 3.6 mEq/L (ref 3.5–5.1)
Sodium: 138 mEq/L (ref 135–145)
Total Bilirubin: 1.4 mg/dL — ABNORMAL HIGH (ref 0.2–1.2)
Total Protein: 7.4 g/dL (ref 6.0–8.3)

## 2023-04-21 LAB — CBC WITH DIFFERENTIAL/PLATELET
Basophils Absolute: 0.1 10*3/uL (ref 0.0–0.1)
Basophils Relative: 1.5 % (ref 0.0–3.0)
Eosinophils Absolute: 0.1 10*3/uL (ref 0.0–0.7)
Eosinophils Relative: 1.4 % (ref 0.0–5.0)
HCT: 44.2 % (ref 36.0–46.0)
Hemoglobin: 14.7 g/dL (ref 12.0–15.0)
Lymphocytes Relative: 35 % (ref 12.0–46.0)
Lymphs Abs: 2.6 10*3/uL (ref 0.7–4.0)
MCHC: 33.2 g/dL (ref 30.0–36.0)
MCV: 93.7 fl (ref 78.0–100.0)
Monocytes Absolute: 0.5 10*3/uL (ref 0.1–1.0)
Monocytes Relative: 6.6 % (ref 3.0–12.0)
Neutro Abs: 4.1 10*3/uL (ref 1.4–7.7)
Neutrophils Relative %: 55.5 % (ref 43.0–77.0)
Platelets: 309 10*3/uL (ref 150.0–400.0)
RBC: 4.72 Mil/uL (ref 3.87–5.11)
RDW: 13 % (ref 11.5–15.5)
WBC: 7.4 10*3/uL (ref 4.0–10.5)

## 2023-04-21 LAB — FOLLICLE STIMULATING HORMONE: FSH: 4.4 m[IU]/mL

## 2023-04-21 LAB — VITAMIN D 25 HYDROXY (VIT D DEFICIENCY, FRACTURES): VITD: 14.78 ng/mL — ABNORMAL LOW (ref 30.00–100.00)

## 2023-04-21 LAB — VITAMIN B12: Vitamin B-12: 220 pg/mL (ref 211–911)

## 2023-04-21 LAB — TSH: TSH: 1.23 u[IU]/mL (ref 0.35–5.50)

## 2023-04-21 MED ORDER — VITAMIN D (ERGOCALCIFEROL) 1.25 MG (50000 UNIT) PO CAPS: 50000.0000 [IU] | ORAL_CAPSULE | ORAL | 0 refills | Status: DC

## 2023-04-21 NOTE — Telephone Encounter (Signed)
Request to [facility] was sent to have patient Medical records release including any  operative reports. Fax # provide by patient [phone]

## 2023-04-21 NOTE — Progress Notes (Unsigned)
Enrolled for [employer] to mail a ZIO XT long term holter monitor to the patients address on file.   Dr. Shari Prows or DOD to read.

## 2023-04-26 ENCOUNTER — Encounter (HOSPITAL_BASED_OUTPATIENT_CLINIC_OR_DEPARTMENT_OTHER): Payer: Self-pay | Admitting: Emergency Medicine

## 2023-04-26 ENCOUNTER — Other Ambulatory Visit: Payer: Self-pay

## 2023-04-26 ENCOUNTER — Emergency Department (HOSPITAL_BASED_OUTPATIENT_CLINIC_OR_DEPARTMENT_OTHER)
Admission: EM | Admit: 2023-04-26 | Discharge: 2023-04-26 | Disposition: A | Discharge status: Home / Self Care | Payer: BC Managed Care – PPO | Attending: Emergency Medicine | Admitting: Emergency Medicine

## 2023-04-26 ENCOUNTER — Emergency Department (HOSPITAL_BASED_OUTPATIENT_CLINIC_OR_DEPARTMENT_OTHER): Payer: BC Managed Care – PPO | Admitting: Radiology

## 2023-04-26 DIAGNOSIS — K5900 Constipation, unspecified: Secondary | ICD-10-CM

## 2023-04-26 DIAGNOSIS — R109 Unspecified abdominal pain: Secondary | ICD-10-CM | POA: Diagnosis not present

## 2023-04-26 HISTORY — DX: Pure hypercholesterolemia, unspecified: E78.00

## 2023-04-26 LAB — CBC WITH DIFFERENTIAL/PLATELET
Abs Immature Granulocytes: 0.01 10*3/uL (ref 0.00–0.07)
Basophils Absolute: 0 10*3/uL (ref 0.0–0.1)
Basophils Relative: 1 %
Eosinophils Absolute: 0.2 10*3/uL (ref 0.0–0.5)
Eosinophils Relative: 2 %
HCT: 43.1 % (ref 36.0–46.0)
Hemoglobin: 14.8 g/dL (ref 12.0–15.0)
Immature Granulocytes: 0 %
Lymphocytes Relative: 35 %
Lymphs Abs: 2.4 10*3/uL (ref 0.7–4.0)
MCH: 31.4 pg (ref 26.0–34.0)
MCHC: 34.3 g/dL (ref 30.0–36.0)
MCV: 91.5 fL (ref 80.0–100.0)
Monocytes Absolute: 0.3 10*3/uL (ref 0.1–1.0)
Monocytes Relative: 5 %
Neutro Abs: 3.9 10*3/uL (ref 1.7–7.7)
Neutrophils Relative %: 57 %
Platelets: 267 10*3/uL (ref 150–400)
RBC: 4.71 MIL/uL (ref 3.87–5.11)
RDW: 11.9 % (ref 11.5–15.5)
WBC: 6.9 10*3/uL (ref 4.0–10.5)
nRBC: 0 % (ref 0.0–0.2)

## 2023-04-26 LAB — URINALYSIS, W/ REFLEX TO CULTURE (INFECTION SUSPECTED)
Bilirubin Urine: NEGATIVE
Glucose, UA: NEGATIVE mg/dL
Ketones, ur: NEGATIVE mg/dL
Leukocytes,Ua: NEGATIVE
Nitrite: NEGATIVE
Protein, ur: NEGATIVE mg/dL
Specific Gravity, Urine: <1.005 — ABNORMAL LOW (ref 1.005–1.030)
pH: 6 (ref 5.0–8.0)

## 2023-04-26 LAB — COMPREHENSIVE METABOLIC PANEL
ALT: 16 U/L (ref 0–44)
AST: 18 U/L (ref 15–41)
Albumin: 4.8 g/dL (ref 3.5–5.0)
Alkaline Phosphatase: 50 U/L (ref 38–126)
Anion gap: 8 (ref 5–15)
BUN: 10 mg/dL (ref 6–20)
CO2: 27 mmol/L (ref 22–32)
Calcium: 9.8 mg/dL (ref 8.9–10.3)
Chloride: 103 mmol/L (ref 98–111)
Creatinine, Ser: 0.78 mg/dL (ref 0.44–1.00)
GFR, Estimated: >60 mL/min (ref >60)
Glucose, Bld: 85 mg/dL (ref 70–99)
Potassium: 3.9 mmol/L (ref 3.5–5.1)
Sodium: 138 mmol/L (ref 135–145)
Total Bilirubin: 1 mg/dL (ref 0.3–1.2)
Total Protein: 7.3 g/dL (ref 6.5–8.1)

## 2023-04-26 LAB — PREGNANCY, URINE: Preg Test, Ur: NEGATIVE

## 2023-04-26 LAB — LIPASE, BLOOD: Lipase: 20 U/L (ref 11–51)

## 2023-04-26 NOTE — ED Notes (Signed)
Pt held soap suds enema for approx 8 min, pt now on bedside commode.

## 2023-04-26 NOTE — ED Provider Notes (Signed)
[facility] AT [facility] Provider Note   CSN: [id number] Arrival date & time: 04/26/23  1307     History  Chief Complaint  Patient presents with   Constipation    Christine Webb is a 52 y.o. female.  Patient is a 52 year old female who presents with constipation.  She states she has not had a bowel movement in last 4 to 5 days.  She started Zepbound not too long ago and says she has had some increased constipation related to that.  She denies any nausea or vomiting.  She denies any fevers.  No urinary symptoms.  No prior history of constipation.  She is taken some MiraLAX and tried an over-the-counter enema without improvement in symptoms.  She feels bloated and has a sense that she needs to have a bowel movement but has not been able to.       Home Medications Prior to Admission medications   Medication Sig Start Date End Date Taking? Authorizing Provider  atorvastatin (LIPITOR) 20 MG tablet TAKE 1 TABLET BY MOUTH EVERY DAY 11/19/22   Jarold Motto, PA  cetirizine (ZYRTEC) 10 MG tablet Take 10 mg by mouth daily as needed for allergies.    [provider]  hydrochlorothiazide (MICROZIDE) 12.5 MG capsule TAKE 1 CAPSULE BY MOUTH EVERY DAY 11/11/22   Jarold Motto, PA  lisinopril (ZESTRIL) 40 MG tablet TAKE 1 TABLET BY MOUTH EVERY DAY 11/11/22   Jarold Motto, PA  meclizine (ANTIVERT) 12.5 MG tablet Take 1 tablet (12.5 mg total) by mouth 3 (three) times daily as needed for dizziness. 09/11/20   Jarold Motto, PA  tirzepatide [facility]) 10 MG/0.5ML Pen Inject 10 mg into the skin once a week. 04/20/23   Jarold Motto, PA  Vitamin D, Ergocalciferol, (DRISDOL) 1.25 MG (50000 UNIT) CAPS capsule Take 1 capsule (50,000 Units total) by mouth every 7 (seven) days. 04/21/23   Jarold Motto, PA      Allergies    Morphine and codeine    Review of Systems   Review of Systems  Constitutional:  Negative for chills, diaphoresis, fatigue and  fever.  HENT:  Negative for congestion, rhinorrhea and sneezing.   Eyes: Negative.   Respiratory:  Negative for cough, chest tightness and shortness of breath.   Cardiovascular:  Negative for chest pain and leg swelling.  Gastrointestinal:  Positive for constipation and nausea. Negative for abdominal pain, blood in stool, diarrhea and vomiting.       Bloating  Genitourinary:  Negative for difficulty urinating, flank pain, frequency and hematuria.  Musculoskeletal:  Negative for arthralgias and back pain.  Skin:  Negative for rash.  Neurological:  Negative for dizziness, speech difficulty, weakness, numbness and headaches.    Physical Exam Updated Vital Signs BP 105/74   Pulse 82   Temp 97.8 F (36.6 C) (Oral)   Resp 18   LMP 03/31/2023 (Exact Date) Comment: ablation  SpO2 98%  Physical Exam Constitutional:      Appearance: She is well-developed.  HENT:     Head: Normocephalic and atraumatic.  Eyes:     Pupils: Pupils are equal, round, and reactive to light.  Cardiovascular:     Rate and Rhythm: Normal rate and regular rhythm.     Heart sounds: Normal heart sounds.  Pulmonary:     Effort: Pulmonary effort is normal. No respiratory distress.     Breath sounds: Normal breath sounds. No wheezing or rales.  Chest:     Chest wall: No tenderness.  Abdominal:     General: Bowel sounds are normal.     Palpations: Abdomen is soft.     Tenderness: There is no abdominal tenderness. There is no guarding or rebound.  Musculoskeletal:        General: Normal range of motion.     Cervical back: Normal range of motion and neck supple.  Lymphadenopathy:     Cervical: No cervical adenopathy.  Skin:    General: Skin is warm and dry.     Findings: No rash.  Neurological:     Mental Status: She is alert and oriented to person, place, and time.     ED Results / Procedures / Treatments   Labs (all labs ordered are listed, but only abnormal results are displayed) Labs Reviewed   URINALYSIS, W/ REFLEX TO CULTURE (INFECTION SUSPECTED) - Abnormal; Notable for the following components:      Result Value   Color, Urine COLORLESS (*)    Specific Gravity, Urine <1.005 (*)    Hgb urine dipstick SMALL (*)    Bacteria, UA MANY (*)    All other components within normal limits  COMPREHENSIVE METABOLIC PANEL  LIPASE, BLOOD  CBC WITH DIFFERENTIAL/PLATELET  PREGNANCY, URINE    EKG None  Radiology DG Abdomen Acute W/Chest  Result Date: 04/26/2023 CLINICAL DATA:  Abdominal pain, constipation. EXAM: DG ABDOMEN ACUTE WITH 1 VIEW CHEST COMPARISON:  None Available. FINDINGS: There is no evidence of dilated bowel loops or free intraperitoneal air. Mild amount of stool is noted in right colon. No radiopaque calculi or other significant radiographic abnormality is seen. Heart size and mediastinal contours are within normal limits. Both lungs are clear. IMPRESSION: Mild stool burden. No abnormal bowel dilatation. No acute cardiopulmonary disease. Electronically Signed   By: Lupita Raider M.D.   On: 04/26/2023 15:22    Procedures Procedures    Medications Ordered in ED Medications - No data to display  ED Course/ Medical Decision Making/ A&P                             Medical Decision Making Amount and/or Complexity of Data Reviewed Labs: ordered. Radiology: ordered.   Patient is a 52 year old female who presents with constipation symptoms.  No fevers.  No vomiting.  Acute abdominal series was done which was interpreted by me and confirmed by the radiologist to show some moderate stool burden.  No evidence of obstruction.  Labs are nonconcerning.  Her urine is not consistent with infection.  She was given a soapsuds enema and feels much better after this.  She did not get a lot of stool out but she feels like she got a lot of gas and some stool out.  Her abdominal exam is nonconcerning.  At this point I do not feel that she needs any further imaging.  She was discharged  home in good condition.  Will continue MiraLAX at home.  I encouraged her to have follow-up with her primary care doctor.  Return precautions were given.  Final Clinical Impression(s) / ED Diagnoses Final diagnoses:  Constipation, unspecified constipation type    Rx / DC Orders ED Discharge Orders     None         Rolan Bucco, MD 04/26/23 1900

## 2023-04-26 NOTE — ED Triage Notes (Signed)
Pt has not had BM since Tuesday. She is having pain under right ribs. Her primary assessed and dx with constipation. Pt has been taking miralax once a day and colace bid and took dose 2 times on Friday. Pt also tried enema yesterday and today with  no results.

## 2023-04-26 NOTE — ED Notes (Signed)
Pt had large liquid stool in BSC. States she feels some relief of abdominal pain.

## 2023-04-26 NOTE — ED Notes (Signed)
Discharge instructions and follow up care reviewed and explained, pt verbalized understanding and had no further questions on d/c. Pt caox4, ambulatory, NAD on d/c. 

## 2023-04-28 DIAGNOSIS — R009 Unspecified abnormalities of heart beat: Secondary | ICD-10-CM | POA: Diagnosis not present

## 2023-04-28 DIAGNOSIS — I2510 Atherosclerotic heart disease of native coronary artery without angina pectoris: Secondary | ICD-10-CM | POA: Diagnosis not present

## 2023-04-28 DIAGNOSIS — R5383 Other fatigue: Secondary | ICD-10-CM

## 2023-05-03 ENCOUNTER — Other Ambulatory Visit: Payer: Self-pay | Admitting: Physician Assistant

## 2023-05-15 ENCOUNTER — Other Ambulatory Visit: Payer: Self-pay | Admitting: Physician Assistant

## 2023-05-26 ENCOUNTER — Other Ambulatory Visit: Payer: Self-pay | Admitting: Physician Assistant

## 2023-05-27 ENCOUNTER — Other Ambulatory Visit (HOSPITAL_BASED_OUTPATIENT_CLINIC_OR_DEPARTMENT_OTHER): Payer: Self-pay

## 2023-05-27 MED ORDER — ZEPBOUND 10 MG/0.5ML ~~LOC~~ SOAJ
10.0000 mg | SUBCUTANEOUS | 0 refills | Status: DC
  Filled 2023-05-27: qty 2, 28d supply, fill #0

## 2023-06-03 ENCOUNTER — Telehealth: Payer: Self-pay | Admitting: Gastroenterology

## 2023-06-03 NOTE — Telephone Encounter (Signed)
Good afternoon Dr. Tomasa Rand  The following patient is being referred to [place] for a colonoscopy. She did receive her last one 5 years ago and is looking to continue her GI care since moving to [facility] from [place]. Records are available with Care Everywhere. Please review and advise of scheduling. Thank you

## 2023-06-10 ENCOUNTER — Other Ambulatory Visit (HOSPITAL_COMMUNITY): Payer: Self-pay

## 2023-06-10 ENCOUNTER — Telehealth: Payer: Self-pay

## 2023-06-10 DIAGNOSIS — L570 Actinic keratosis: Secondary | ICD-10-CM | POA: Diagnosis not present

## 2023-06-10 DIAGNOSIS — L820 Inflamed seborrheic keratosis: Secondary | ICD-10-CM | POA: Diagnosis not present

## 2023-06-10 DIAGNOSIS — D2272 Melanocytic nevi of left lower limb, including hip: Secondary | ICD-10-CM | POA: Diagnosis not present

## 2023-06-10 DIAGNOSIS — L578 Other skin changes due to chronic exposure to nonionizing radiation: Secondary | ICD-10-CM | POA: Diagnosis not present

## 2023-06-10 DIAGNOSIS — D225 Melanocytic nevi of trunk: Secondary | ICD-10-CM | POA: Diagnosis not present

## 2023-06-10 DIAGNOSIS — L814 Other melanin hyperpigmentation: Secondary | ICD-10-CM | POA: Diagnosis not present

## 2023-06-10 NOTE — Telephone Encounter (Signed)
Pharmacy Patient Advocate Encounter   Received notification from CoverMyMeds that prior authorization for ZEPBOUND 2.5MG /0.5ML is required/requested.   Insurance verification completed.   The patient is insured through [employer] .   Per test claim: PA required; PA submitted to [employer] via CoverMyMeds Key/confirmation #/EOC [id number] Status is pending

## 2023-06-10 NOTE — Telephone Encounter (Signed)
Pharmacy Patient Advocate Encounter  Received notification from [employer] that Prior Authorization for  ZEPBOUND 2.5MG /0.5ML  has been APPROVED from 05/11/23 to 02/05/24   PA #/Case ID/Reference #: [id number]

## 2023-06-10 NOTE — Telephone Encounter (Signed)
Left VM to schedule direct colonoscopy.

## 2023-06-15 ENCOUNTER — Encounter: Payer: Self-pay | Admitting: Gastroenterology

## 2023-06-18 ENCOUNTER — Other Ambulatory Visit: Payer: Self-pay | Admitting: Physician Assistant

## 2023-06-19 ENCOUNTER — Other Ambulatory Visit: Payer: Self-pay

## 2023-06-19 ENCOUNTER — Other Ambulatory Visit (HOSPITAL_BASED_OUTPATIENT_CLINIC_OR_DEPARTMENT_OTHER): Payer: Self-pay

## 2023-06-19 MED ORDER — ZEPBOUND 10 MG/0.5ML ~~LOC~~ SOAJ
10.0000 mg | SUBCUTANEOUS | 0 refills | Status: DC
  Filled 2023-06-19: qty 2, 28d supply, fill #0

## 2023-06-22 ENCOUNTER — Other Ambulatory Visit (HOSPITAL_BASED_OUTPATIENT_CLINIC_OR_DEPARTMENT_OTHER): Payer: Self-pay

## 2023-06-23 ENCOUNTER — Other Ambulatory Visit (HOSPITAL_BASED_OUTPATIENT_CLINIC_OR_DEPARTMENT_OTHER): Payer: Self-pay

## 2023-06-29 ENCOUNTER — Ambulatory Visit (AMBULATORY_SURGERY_CENTER): Payer: BC Managed Care – PPO

## 2023-06-29 VITALS — Ht 63.5 in | Wt 190.0 lb

## 2023-06-29 DIAGNOSIS — Z800 Family history of malignant neoplasm of digestive organs: Secondary | ICD-10-CM

## 2023-06-29 MED ORDER — NA SULFATE-K SULFATE-MG SULF 17.5-3.13-1.6 GM/177ML PO SOLN
1.0000 | Freq: Once | ORAL | 0 refills | Status: AC
Start: 2023-06-29 — End: 2023-06-29

## 2023-06-29 NOTE — Progress Notes (Signed)
No egg or soy allergy known to patient  No issues known to pt with past sedation with any surgeries or procedures Patient denies ever being told they had issues or difficulty with intubation  No FH of Malignant Hyperthermia Pt is not on diet pills Pt is not on  home 02  Pt is not on blood thinners  Pt reports drug induced constipation with zepbound injections  No A fib or A flutter Have any cardiac testing pending--no  LOA: independent  Prep: suprep   Patient's chart reviewed by Cathlyn Parsons CNRA prior to previsit and patient appropriate for the [facility].  Previsit completed and red dot placed by patient's name on their procedure day (on provider's schedule).     PV competed with patient. Prep instructions sent via mychart and home address. Goodrx coupon for provided to use for price reduction if needed.

## 2023-07-15 ENCOUNTER — Other Ambulatory Visit: Payer: Self-pay | Admitting: Physician Assistant

## 2023-07-15 ENCOUNTER — Encounter: Payer: Self-pay | Admitting: *Deleted

## 2023-07-17 ENCOUNTER — Other Ambulatory Visit: Payer: Self-pay

## 2023-07-17 ENCOUNTER — Other Ambulatory Visit (HOSPITAL_COMMUNITY): Payer: Self-pay

## 2023-07-17 ENCOUNTER — Other Ambulatory Visit (HOSPITAL_BASED_OUTPATIENT_CLINIC_OR_DEPARTMENT_OTHER): Payer: Self-pay

## 2023-07-17 MED ORDER — ZEPBOUND 10 MG/0.5ML ~~LOC~~ SOAJ
10.0000 mg | SUBCUTANEOUS | 0 refills | Status: DC
Start: 2023-07-17 — End: 2023-09-01
  Filled 2023-07-17 – 2023-07-29 (×3): qty 2, 28d supply, fill #0

## 2023-07-20 ENCOUNTER — Encounter: Payer: Self-pay | Admitting: Gastroenterology

## 2023-07-28 ENCOUNTER — Other Ambulatory Visit (HOSPITAL_BASED_OUTPATIENT_CLINIC_OR_DEPARTMENT_OTHER): Payer: Self-pay

## 2023-07-29 ENCOUNTER — Other Ambulatory Visit (HOSPITAL_BASED_OUTPATIENT_CLINIC_OR_DEPARTMENT_OTHER): Payer: Self-pay

## 2023-07-30 ENCOUNTER — Encounter: Payer: Self-pay | Admitting: Gastroenterology

## 2023-07-30 ENCOUNTER — Ambulatory Visit: Payer: BC Managed Care – PPO | Admitting: Gastroenterology

## 2023-07-30 VITALS — BP 107/66 | HR 73 | Temp 97.5°F | Resp 12 | Ht 63.0 in | Wt 190.0 lb

## 2023-07-30 DIAGNOSIS — Z800 Family history of malignant neoplasm of digestive organs: Secondary | ICD-10-CM

## 2023-07-30 DIAGNOSIS — Z1211 Encounter for screening for malignant neoplasm of colon: Secondary | ICD-10-CM

## 2023-07-30 MED ORDER — SODIUM CHLORIDE 0.9 % IV SOLN: 500.0000 mL | Freq: Once | INTRAVENOUS | Status: DC

## 2023-07-30 NOTE — Op Note (Addendum)
[facility] Patient Name: Christine Webb Procedure Date: 07/30/2023 11:32 AM MRN: [id number] Endoscopist: Lorin Picket E. Tomasa Rand , MD, [id number] Age: 52 Referring MD:  Date of Birth: [birth year] Gender: Female Account #: [id number] Procedure:                Colonoscopy Indications:              Screening in patient at increased risk: Colorectal                            cancer in mother 14 or older; last colonoscopy in                            [place] in 2019, normal per patient Medicines:                Monitored Anesthesia Care Procedure:                Pre-Anesthesia Assessment:                           - Prior to the procedure, a History and Physical                            was performed, and patient medications and                            allergies were reviewed. The patient's tolerance of                            previous anesthesia was also reviewed. The risks                            and benefits of the procedure and the sedation                            options and risks were discussed with the patient.                            All questions were answered, and informed consent                            was obtained. Prior Anticoagulants: The patient has                            taken no anticoagulant or antiplatelet agents. ASA                            Grade Assessment: II - A patient with mild systemic                            disease. After reviewing the risks and benefits,                            the patient was deemed in satisfactory condition to  undergo the procedure.                           After obtaining informed consent, the colonoscope                            was passed under direct vision. Throughout the                            procedure, the patient's blood pressure, pulse, and                            oxygen saturations were monitored continuously. The                            Olympus  Scope SN: [id number] was introduced through                            the anus and advanced to the the terminal ileum,                            with identification of the appendiceal orifice and                            IC valve. The colonoscopy was performed without                            difficulty. The patient tolerated the procedure                            well. The quality of the bowel preparation was                            adequate. The terminal ileum, ileocecal valve,                            appendiceal orifice, and rectum were photographed.                            The bowel preparation used was SUPREP via split                            dose instruction. Scope In: 11:39:22 AM Scope Out: 11:56:16 AM Scope Withdrawal Time: 0 hours 13 minutes 0 seconds  Total Procedure Duration: 0 hours 16 minutes 54 seconds  Findings:                 The perianal and digital rectal examinations were                            normal. Pertinent negatives include normal                            sphincter tone and no palpable rectal lesions.  The colon (entire examined portion) appeared normal.                           The terminal ileum appeared normal.                           The retroflexed view of the distal rectum and anal                            verge was normal and showed no anal or rectal                            abnormalities. Complications:            No immediate complications. Estimated Blood Loss:     Estimated blood loss: none. Impression:               - The entire examined colon is normal.                           - The examined portion of the ileum was normal.                           - The distal rectum and anal verge are normal on                            retroflexion view.                           - No specimens collected. Recommendation:           - Patient has a contact number available for                             emergencies. The signs and symptoms of potential                            delayed complications were discussed with the                            patient. Return to normal activities tomorrow.                            Written discharge instructions were provided to the                            patient.                           - Resume previous diet.                           - Continue present medications.                           - Repeat colonoscopy in 10 years for screening  purposes. Xzavier Swinger E. Tomasa Rand, MD 07/30/2023 12:00:43 PM This report has been signed electronically.

## 2023-07-30 NOTE — Progress Notes (Signed)
Vss nad trans to pacu 

## 2023-07-30 NOTE — Patient Instructions (Signed)
Resume previous diet. Continue present medications. Repeat colonoscopy in 10 years for screening purposes.   YOU HAD AN ENDOSCOPIC PROCEDURE TODAY AT THE [facility]:   Refer to the procedure report that was given to you for any specific questions about what was found during the examination.  If the procedure report does not answer your questions, please call your gastroenterologist to clarify.  If you requested that your care partner not be given the details of your procedure findings, then the procedure report has been included in a sealed envelope for you to review at your convenience later.  YOU SHOULD EXPECT: Some feelings of bloating in the abdomen. Passage of more gas than usual.  Walking can help get rid of the air that was put into your GI tract during the procedure and reduce the bloating. If you had a lower endoscopy (such as a colonoscopy or flexible sigmoidoscopy) you may notice spotting of blood in your stool or on the toilet paper. If you underwent a bowel prep for your procedure, you may not have a normal bowel movement for a few days.  Please Note:  You might notice some irritation and congestion in your nose or some drainage.  This is from the oxygen used during your procedure.  There is no need for concern and it should clear up in a day or so.  SYMPTOMS TO REPORT IMMEDIATELY:  Following lower endoscopy (colonoscopy or flexible sigmoidoscopy):  Excessive amounts of blood in the stool  Significant tenderness or worsening of abdominal pains  Swelling of the abdomen that is new, acute  Fever of 100F or higher  For urgent or emergent issues, a gastroenterologist can be reached at any hour by calling [phone]. Do not use MyChart messaging for urgent concerns.    DIET:  We do recommend a small meal at first, but then you may proceed to your regular diet.  Drink plenty of fluids but you should avoid alcoholic beverages for 24 hours.  ACTIVITY:  You should plan  to take it easy for the rest of today and you should NOT DRIVE or use heavy machinery until tomorrow (because of the sedation medicines used during the test).    FOLLOW UP: Our staff will call the number listed on your records the next business day following your procedure.  We will call around 7:15- 8:00 am to check on you and address any questions or concerns that you may have regarding the information given to you following your procedure. If we do not reach you, we will leave a message.     If any biopsies were taken you will be contacted by phone or by letter within the next 1-3 weeks.  Please call us at [phone] if you have not heard about the biopsies in 3 weeks.    SIGNATURES/CONFIDENTIALITY: You and/or your care partner have signed paperwork which will be entered into your electronic medical record.  These signatures attest to the fact that that the information above on your After Visit Summary has been reviewed and is understood.  Full responsibility of the confidentiality of this discharge information lies with you and/or your care-partner. 

## 2023-07-30 NOTE — Progress Notes (Signed)
[facility] History and Physical   Primary Care Physician:  Christine Webb, [place]   Reason for Procedure:   Colon cancer screening  Plan:    Screening colonoscopy     HPI: Christine Webb is a 52 y.o. female undergoing screening colonoscopy.  Her mother was diagnosed with metastatic intestinal cancer at age 44, presumed colonic.  She has no chronic GI symptoms.  She underwent a colonoscopy in 2019 in [place] which was normal per patient.   Past Medical History:  Diagnosis Date   Allergy    Arthritis    History of chicken pox    History of shingles    Hypercholesteremia    Hypertension    Iron deficiency anemia    Ablation; did require transfusions    Past Surgical History:  Procedure Laterality Date   CESAREAN SECTION  2002, 2004   CHOLECYSTECTOMY N/A 12/07/2020   Procedure: LAPAROSCOPIC CHOLECYSTECTOMY WITH INTRAOPERATIVE CHOLANGIOGRAM;  Surgeon: Christine Level, MD;  Location: [facility];  Service: General;  Laterality: N/A;  ROOM 1 STARTING AT 07:30AM FOR 90 MIN   KNEE SURGERY  2012   3 surgeries    Prior to Admission medications   Medication Sig Start Date End Date Taking? Authorizing Provider  atorvastatin (LIPITOR) 20 MG tablet TAKE 1 TABLET BY MOUTH EVERY DAY 05/15/23  Yes Christine Motto, PA  cetirizine (ZYRTEC) 10 MG tablet Take 10 mg by mouth daily as needed for allergies.   Yes [provider]  hydrochlorothiazide (MICROZIDE) 12.5 MG capsule TAKE 1 CAPSULE BY MOUTH EVERY DAY 05/04/23  Yes Christine Webb, [place], PA  lisinopril (ZESTRIL) 40 MG tablet TAKE 1 TABLET BY MOUTH EVERY DAY 05/04/23  Yes Christine Motto, PA  meclizine (ANTIVERT) 12.5 MG tablet Take 1 tablet (12.5 mg total) by mouth 3 (three) times daily as needed for dizziness. 09/11/20   Christine Motto, PA  tirzepatide [facility]) 10 MG/0.5ML Pen Inject 10 mg into the skin once a week. 07/17/23   Christine Motto, PA  Vitamin D, Ergocalciferol, (DRISDOL) 1.25 MG (50000 UNIT) CAPS capsule Take 1  capsule (50,000 Units total) by mouth every 7 (seven) days. 04/21/23   Christine Motto, PA    Current Outpatient Medications  Medication Sig Dispense Refill   atorvastatin (LIPITOR) 20 MG tablet TAKE 1 TABLET BY MOUTH EVERY DAY 90 tablet 1   cetirizine (ZYRTEC) 10 MG tablet Take 10 mg by mouth daily as needed for allergies.     hydrochlorothiazide (MICROZIDE) 12.5 MG capsule TAKE 1 CAPSULE BY MOUTH EVERY DAY 90 capsule 1   lisinopril (ZESTRIL) 40 MG tablet TAKE 1 TABLET BY MOUTH EVERY DAY 90 tablet 1   meclizine (ANTIVERT) 12.5 MG tablet Take 1 tablet (12.5 mg total) by mouth 3 (three) times daily as needed for dizziness. 30 tablet 0   tirzepatide (ZEPBOUND) 10 MG/0.5ML Pen Inject 10 mg into the skin once a week. 2 mL 0   Vitamin D, Ergocalciferol, (DRISDOL) 1.25 MG (50000 UNIT) CAPS capsule Take 1 capsule (50,000 Units total) by mouth every 7 (seven) days. 12 capsule 0   Current Facility-Administered Medications  Medication Dose Route Frequency Provider Last Rate Last Admin   0.9 %  sodium chloride infusion  500 mL Intravenous Once Christine Lucks, MD        Allergies as of 07/30/2023 - Review Complete 07/30/2023  Allergen Reaction Noted   Morphine and codeine  11/21/2020    Family History  Problem Relation Age of Onset   Hypertension Mother    Breast  cancer Mother    Colon cancer Mother    Congestive Heart Failure Mother    Osteoarthritis Father    Hypertension Father    Skin cancer Brother    Lung cancer Maternal Grandmother    Kidney cancer Paternal Grandmother    Colon polyps Neg Hx    Esophageal cancer Neg Hx    Rectal cancer Neg Hx    Stomach cancer Neg Hx     Social History   Socioeconomic History   Marital status: Married    Spouse name: Not on file   Number of children: Not on file   Years of education: Not on file   Highest education Webb: Not on file  Occupational History   Not on file  Tobacco Use   Smoking status: Never   Smokeless tobacco: Never   Vaping Use   Vaping status: Never Used  Substance and Sexual Activity   Alcohol use: Yes    Alcohol/week: 3.0 standard drinks of alcohol    Types: 3 Glasses of wine per week    Comment: weekends only   Drug use: Never   Sexual activity: Yes    Birth control/protection: Surgical    Comment: Husband Vasectomy  Other Topics Concern   Not on file  Social History Narrative   Married   Work FT -- Teacher, English as a foreign language   2 children (1 college, 1 high school)   Likes to travel   Social Determinants of Corporate investment banker Strain: Not on file  Food Insecurity: Not on file  Transportation Needs: Not on file  Physical Activity: Insufficiently Active (10/11/2018)   Received from [employer] and [employer], [employer] and [employer]   Exercise Vital Sign    Days of Exercise per Week: 3 days    Minutes of Exercise per Session: 30 min  Stress: Stress Concern Present (10/11/2018)   Received from [employer] and [employer], [employer] and [employer] of Occupational Health - Occupational Stress Questionnaire    Feeling of Stress : Rather much  Social Connections: Not on file  Intimate Partner Violence: Not on file    Review of Systems:  All other review of systems negative except as mentioned in the HPI.  Physical Exam: Vital signs BP 109/73   Pulse 89   Temp (!) 97.5 F (36.4 C)   Ht 5\' 3"  (1.6 m)   Wt 190 lb (86.2 kg)   LMP 07/07/2023 (Exact Date)   SpO2 99%   BMI 33.66 kg/m   General:   Alert,  Well-developed, well-nourished, pleasant and cooperative in NAD Airway:  Mallampati 2 Lungs:  Clear throughout to auscultation.   Heart:  Regular rate and rhythm; no murmurs, clicks, rubs,  or gallops. Abdomen:  Soft, nontender and nondistended. Normal bowel sounds.   Neuro/Psych:  Normal mood and affect. A and O x 3   Christine Minnie E. Tomasa Rand, MD [facility]

## 2023-07-30 NOTE — Progress Notes (Signed)
Pt's states no medical or surgical changes since previsit or office visit. 

## 2023-07-31 ENCOUNTER — Telehealth: Payer: Self-pay

## 2023-07-31 NOTE — Telephone Encounter (Signed)
Follow up call to pt, lm for pt to call if having any difficulty with normal activities or eating and drinking.  Also to call if any other questions or concerns.  

## 2023-08-05 DIAGNOSIS — Z1231 Encounter for screening mammogram for malignant neoplasm of breast: Secondary | ICD-10-CM | POA: Diagnosis not present

## 2023-08-05 DIAGNOSIS — Z01419 Encounter for gynecological examination (general) (routine) without abnormal findings: Secondary | ICD-10-CM | POA: Diagnosis not present

## 2023-08-05 DIAGNOSIS — Z6831 Body mass index (BMI) 31.0-31.9, adult: Secondary | ICD-10-CM | POA: Diagnosis not present

## 2023-08-05 LAB — HM MAMMOGRAPHY

## 2023-08-20 NOTE — Progress Notes (Signed)
Christine Webb is a 52 y.o. female here for a follow up of a pre-existing problem.  History of Present Illness:   Chief Complaint  Patient presents with   Obesity    Pt here for f/u on Zepbound, currently taking 10 mg.   Abdominal Pain    Pt still c/o pain right upper quadrant.    HPI  Obesity Taking Zepbound 10 mg weekly. She has experienced intermittent nausea while taking this. She has not noticed improved weight loss with increasing doses. Believes weight loss has slowed. Zepbound helps with appetite reduction but this is mostly due to nausea for her. She will have a new insurance plan starting 09/2022. Inconsistent exercise currently, but she is trying to get back on track.  Abdominal Pain Has been having upper and right-sided abdominal pain for some time dating before her cholecystectomy in 2022. Interested in having imaging of abdomen. Seen in ED on 04/26/23 for constipation. Does not believe this is associated with constipation. Taking daily fiber supplement.  Travel Advice Encounter Requesting scopolamine patches for upcoming trip.  Past Medical History:  Diagnosis Date   Allergy    Arthritis    History of chicken pox    History of shingles    Hypercholesteremia    Hypertension    Iron deficiency anemia    Ablation; did require transfusions     Social History   Tobacco Use   Smoking status: Never   Smokeless tobacco: Never  Vaping Use   Vaping status: Never Used  Substance Use Topics   Alcohol use: Yes    Alcohol/week: 3.0 standard drinks of alcohol    Types: 3 Glasses of wine per week    Comment: weekends only   Drug use: Never    Past Surgical History:  Procedure Laterality Date   CESAREAN SECTION  2002, 2004   CHOLECYSTECTOMY N/A 12/07/2020   Procedure: LAPAROSCOPIC CHOLECYSTECTOMY WITH INTRAOPERATIVE CHOLANGIOGRAM;  Surgeon: Darnell Level, MD;  Location: [facility];  Service: General;  Laterality: N/A;  ROOM 1 STARTING AT 07:30AM FOR 90 MIN   KNEE  SURGERY  2012   3 surgeries    Family History  Problem Relation Age of Onset   Hypertension Mother    Breast cancer Mother    Colon cancer Mother    Congestive Heart Failure Mother    Osteoarthritis Father    Hypertension Father    Skin cancer Brother    Lung cancer Maternal Grandmother    Kidney cancer Paternal Grandmother    Colon polyps Neg Hx    Esophageal cancer Neg Hx    Rectal cancer Neg Hx    Stomach cancer Neg Hx     Allergies  Allergen Reactions   Morphine And Codeine     Pt preference - makes pt really itchy     Current Medications:   Current Outpatient Medications:    atorvastatin (LIPITOR) 20 MG tablet, TAKE 1 TABLET BY MOUTH EVERY DAY, Disp: 90 tablet, Rfl: 1   cetirizine (ZYRTEC) 10 MG tablet, Take 10 mg by mouth daily as needed for allergies., Disp: , Rfl:    cyanocobalamin (VITAMIN B12) 1000 MCG tablet, Take 1,000 mcg by mouth daily., Disp: , Rfl:    hydrochlorothiazide (MICROZIDE) 12.5 MG capsule, TAKE 1 CAPSULE BY MOUTH EVERY DAY, Disp: 90 capsule, Rfl: 1   lisinopril (ZESTRIL) 40 MG tablet, TAKE 1 TABLET BY MOUTH EVERY DAY, Disp: 90 tablet, Rfl: 1   meclizine (ANTIVERT) 12.5 MG tablet, Take 1 tablet (12.5 mg  total) by mouth 3 (three) times daily as needed for dizziness., Disp: 30 tablet, Rfl: 0   scopolamine (TRANSDERM-SCOP) 1 MG/3DAYS, Place 1 patch (1.5 mg total) onto the skin every 3 (three) days., Disp: 10 patch, Rfl: 1   tirzepatide (ZEPBOUND) 10 MG/0.5ML Pen, Inject 10 mg into the skin once a week., Disp: 2 mL, Rfl: 0   tirzepatide (ZEPBOUND) 7.5 MG/0.5ML Pen, Inject 7.5 mg into the skin once a week., Disp: 6 mL, Rfl: 1   VITAMIN D, CHOLECALCIFEROL, PO, Take 1 capsule by mouth daily in the afternoon., Disp: , Rfl:    Review of Systems:   Review of Systems  Constitutional:  Negative for fever and malaise/fatigue.  HENT:  Negative for congestion.   Eyes:  Negative for blurred vision.  Respiratory:  Negative for cough and shortness of breath.    Cardiovascular:  Negative for chest pain, palpitations and leg swelling.  Gastrointestinal:  Positive for abdominal pain and nausea. Negative for vomiting.  Musculoskeletal:  Negative for back pain.  Skin:  Negative for rash.  Neurological:  Negative for loss of consciousness and headaches.    Vitals:   Vitals:   08/21/23 1303  BP: 110/80  Pulse: 84  Temp: (!) 97.3 F (36.3 C)  TempSrc: Temporal  SpO2: 97%  Weight: 189 lb 8 oz (86 kg)  Height: 5\' 3"  (1.6 m)     Body mass index is 33.57 kg/m.  Physical Exam:   Physical Exam Vitals and nursing note reviewed.  Constitutional:      General: She is not in acute distress.    Appearance: She is well-developed. She is not ill-appearing or toxic-appearing.  Cardiovascular:     Rate and Rhythm: Normal rate and regular rhythm.     Pulses: Normal pulses.     Heart sounds: Normal heart sounds, S1 normal and S2 normal.  Pulmonary:     Effort: Pulmonary effort is normal.     Breath sounds: Normal breath sounds.  Abdominal:     General: Abdomen is flat. Bowel sounds are normal.     Palpations: Abdomen is soft.     Tenderness: There is no abdominal tenderness.  Skin:    General: Skin is warm and dry.  Neurological:     Mental Status: She is alert.     GCS: GCS eye subscore is 4. GCS verbal subscore is 5. GCS motor subscore is 6.  Psychiatric:        Speech: Speech normal.        Behavior: Behavior normal. Behavior is cooperative.     Assessment and Plan:   RUQ pain No red flags or evidence of acute abdomen on my exam Recommend regular intake of fiber and management/prevention of constipation Will obtain CT abdomen/pelvis for further evaluation to rule out mass or other concerning pathology  Obesity, unspecified class, unspecified obesity type, unspecified whether serious comorbidity present Continue efforts at healthy lifestyle Will decrease dosage to 7.5 mg weekly and plan to likely stretch out dosing to every 10 days  and eventually every 14 days Encouraged increase in exercise  Travel advice encounter Scopolamine patches to be ordered  I,Alexander Ruley,acting as a Neurosurgeon for [employer], PA.,have documented all relevant documentation on the behalf of Jarold Motto, PA,as directed by  Jarold Motto, PA while in the presence of Jarold Motto, [place].  I, Jarold Motto, [place], have reviewed all documentation for this visit. The documentation on 08/21/23 for the exam, diagnosis, procedures, and orders are all accurate and  complete.  Jarold Motto, PA-C

## 2023-08-21 ENCOUNTER — Ambulatory Visit (INDEPENDENT_AMBULATORY_CARE_PROVIDER_SITE_OTHER): Payer: BC Managed Care – PPO | Admitting: Physician Assistant

## 2023-08-21 ENCOUNTER — Encounter: Payer: Self-pay | Admitting: Physician Assistant

## 2023-08-21 ENCOUNTER — Other Ambulatory Visit (HOSPITAL_BASED_OUTPATIENT_CLINIC_OR_DEPARTMENT_OTHER): Payer: Self-pay

## 2023-08-21 VITALS — BP 110/80 | HR 84 | Temp 97.3°F | Ht 63.0 in | Wt 189.5 lb

## 2023-08-21 DIAGNOSIS — Z7184 Encounter for health counseling related to travel: Secondary | ICD-10-CM

## 2023-08-21 DIAGNOSIS — E669 Obesity, unspecified: Secondary | ICD-10-CM | POA: Diagnosis not present

## 2023-08-21 DIAGNOSIS — R1011 Right upper quadrant pain: Secondary | ICD-10-CM

## 2023-08-21 MED ORDER — ZEPBOUND 7.5 MG/0.5ML ~~LOC~~ SOAJ
7.5000 mg | SUBCUTANEOUS | 1 refills | Status: DC
  Filled 2023-08-21: qty 2, 28d supply, fill #0

## 2023-08-21 MED ORDER — SCOPOLAMINE 1 MG/3DAYS TD PT72: 1.0000 | MEDICATED_PATCH | TRANSDERMAL | 1 refills | Status: DC

## 2023-08-21 NOTE — Patient Instructions (Addendum)
It was great to see you!  We will order CT scan of your abdomen/pelvis  Decrease Zepbound to 7.5 mg weekly -- may decrease frequency to every 10-14 days  Recommend moderate walking, 3-5 times/week for 30-50 minutes each session. Aim for at least 150 minutes.week. Goal should be pace of 3 miles/hours, or walking 1.5 miles in 30 minutes   Let's follow-up in 3-6 months, sooner if you have concerns.  Take care,  Jarold Motto PA-C

## 2023-08-25 ENCOUNTER — Other Ambulatory Visit: Payer: BC Managed Care – PPO

## 2023-08-25 ENCOUNTER — Inpatient Hospital Stay
Admission: RE | Admit: 2023-08-25 | Discharge: 2023-08-25 | Discharge status: Home / Self Care | Payer: BC Managed Care – PPO | Source: Ambulatory Visit | Attending: Physician Assistant

## 2023-08-25 DIAGNOSIS — R1011 Right upper quadrant pain: Secondary | ICD-10-CM

## 2023-08-25 DIAGNOSIS — R109 Unspecified abdominal pain: Secondary | ICD-10-CM | POA: Diagnosis not present

## 2023-08-25 DIAGNOSIS — G8929 Other chronic pain: Secondary | ICD-10-CM | POA: Diagnosis not present

## 2023-08-25 MED ORDER — IOPAMIDOL (ISOVUE-300) INJECTION 61%
100.0000 mL | Freq: Once | INTRAVENOUS | Status: AC | PRN
  Administered 2023-08-25: 100 mL via INTRAVENOUS

## 2023-08-31 ENCOUNTER — Telehealth: Payer: Self-pay | Admitting: Physician Assistant

## 2023-08-31 NOTE — Telephone Encounter (Signed)
Pt states insurance wont pay for the  tirzepatide (ZEPBOUND) 7.5 MG/0.5ML Pen  But they will pay a 90 day supply of the 5mg    [facility] Phone: [phone] Fax: [phone]

## 2023-08-31 NOTE — Telephone Encounter (Signed)
Please see message about Zepbound and advise.

## 2023-09-01 ENCOUNTER — Encounter: Payer: Self-pay | Admitting: Physician Assistant

## 2023-09-01 ENCOUNTER — Other Ambulatory Visit (HOSPITAL_BASED_OUTPATIENT_CLINIC_OR_DEPARTMENT_OTHER): Payer: Self-pay

## 2023-09-01 MED ORDER — ZEPBOUND 5 MG/0.5ML ~~LOC~~ SOAJ
5.0000 mg | SUBCUTANEOUS | 0 refills | Status: DC
  Filled 2023-09-01 – 2023-09-03 (×2): qty 2, 28d supply, fill #0
  Filled 2023-09-24: qty 2, 28d supply, fill #1
  Filled 2023-10-24 – 2023-10-30 (×5): qty 2, 28d supply, fill #2

## 2023-09-01 NOTE — Telephone Encounter (Signed)
Pt notified Rx for Zepbound 5 mg, 90 day supply sent to [facility] pharmacy.

## 2023-09-03 ENCOUNTER — Other Ambulatory Visit (HOSPITAL_BASED_OUTPATIENT_CLINIC_OR_DEPARTMENT_OTHER): Payer: Self-pay

## 2023-09-26 ENCOUNTER — Other Ambulatory Visit (HOSPITAL_BASED_OUTPATIENT_CLINIC_OR_DEPARTMENT_OTHER): Payer: Self-pay

## 2023-09-28 ENCOUNTER — Other Ambulatory Visit (HOSPITAL_BASED_OUTPATIENT_CLINIC_OR_DEPARTMENT_OTHER): Payer: Self-pay

## 2023-10-26 ENCOUNTER — Telehealth: Payer: Self-pay

## 2023-10-26 ENCOUNTER — Other Ambulatory Visit (HOSPITAL_COMMUNITY): Payer: Self-pay

## 2023-10-26 ENCOUNTER — Other Ambulatory Visit: Payer: Self-pay

## 2023-10-26 ENCOUNTER — Telehealth: Payer: Self-pay | Admitting: *Deleted

## 2023-10-26 NOTE — Telephone Encounter (Signed)
Noted

## 2023-10-26 NOTE — Telephone Encounter (Signed)
Message already sent to PA Team.

## 2023-10-26 NOTE — Telephone Encounter (Signed)
Copied from CRM [id number]. Topic: Clinical - Prescription Issue >> Oct 26, 2023 12:56 PM Samuel Jester B wrote: Reason for CRM: Ephriam Knuckles called from [employer] regading the pt medication  Zepbound is needing a prior authorization. Callback is [phone]  Please see msg for patient medication needing PA and advise on status if PA has been started or outcome results for patient.

## 2023-10-26 NOTE — Telephone Encounter (Addendum)
Pharmacy Patient Advocate Encounter   Received notification from Pt Calls Messages that prior authorization for Zepbound 5mg /0.45ml is required/requested.   Insurance verification completed.   The patient is insured through [employer] .   Per test claim: Refill too soon last fill on 10/24/23 next fill 11/18/23. Placed a call to the insurance and per the representative, there is an active approval on file that is good until 02/05/2024.

## 2023-10-26 NOTE — Telephone Encounter (Signed)
Copied from CRM [id number]. Topic: Clinical - Prescription Issue >> Oct 26, 2023 12:56 PM Samuel Jester B wrote: Reason for CRM: Ephriam Knuckles called from [employer] regading the pt medication  Zepbound is needing a prior authorization. Callback is [phone]

## 2023-10-29 ENCOUNTER — Other Ambulatory Visit (HOSPITAL_COMMUNITY): Payer: Self-pay

## 2023-10-29 ENCOUNTER — Other Ambulatory Visit (HOSPITAL_BASED_OUTPATIENT_CLINIC_OR_DEPARTMENT_OTHER): Payer: Self-pay

## 2023-10-29 DIAGNOSIS — M549 Dorsalgia, unspecified: Secondary | ICD-10-CM | POA: Diagnosis not present

## 2023-10-29 DIAGNOSIS — N200 Calculus of kidney: Secondary | ICD-10-CM | POA: Diagnosis not present

## 2023-10-29 DIAGNOSIS — M5442 Lumbago with sciatica, left side: Secondary | ICD-10-CM | POA: Diagnosis not present

## 2023-10-29 DIAGNOSIS — L4050 Arthropathic psoriasis, unspecified: Secondary | ICD-10-CM | POA: Diagnosis not present

## 2023-10-30 ENCOUNTER — Other Ambulatory Visit (HOSPITAL_COMMUNITY): Payer: Self-pay

## 2023-10-30 ENCOUNTER — Other Ambulatory Visit (HOSPITAL_BASED_OUTPATIENT_CLINIC_OR_DEPARTMENT_OTHER): Payer: Self-pay

## 2023-10-30 ENCOUNTER — Encounter: Payer: Self-pay | Admitting: Physician Assistant

## 2023-10-30 DIAGNOSIS — M9908 Segmental and somatic dysfunction of rib cage: Secondary | ICD-10-CM | POA: Diagnosis not present

## 2023-10-30 DIAGNOSIS — M9902 Segmental and somatic dysfunction of thoracic region: Secondary | ICD-10-CM | POA: Diagnosis not present

## 2023-10-30 DIAGNOSIS — M9903 Segmental and somatic dysfunction of lumbar region: Secondary | ICD-10-CM | POA: Diagnosis not present

## 2023-10-30 DIAGNOSIS — M546 Pain in thoracic spine: Secondary | ICD-10-CM | POA: Diagnosis not present

## 2023-11-02 MED ORDER — ZEPBOUND 5 MG/0.5ML ~~LOC~~ SOAJ: 5.0000 mg | SUBCUTANEOUS | 0 refills | Status: DC

## 2023-11-04 ENCOUNTER — Other Ambulatory Visit: Payer: Self-pay | Admitting: *Deleted

## 2023-11-04 MED ORDER — ZEPBOUND 5 MG/0.5ML ~~LOC~~ SOLN: 5.0000 mg | SUBCUTANEOUS | 0 refills | Status: DC

## 2023-11-10 ENCOUNTER — Other Ambulatory Visit: Payer: Self-pay | Admitting: Physician Assistant

## 2023-11-23 ENCOUNTER — Other Ambulatory Visit: Payer: Self-pay | Admitting: Physician Assistant

## 2023-12-07 NOTE — Progress Notes (Signed)
 Subjective:    Christine Webb is a 53 y.o. female and is here for a comprehensive physical exam.  HPI  Health Maintenance Due  Topic Date Due   Pneumococcal Vaccine 29-56 Years old (1 of 2 - PCV) Never done   Zoster Vaccines- Shingrix (1 of 2) Never done   MAMMOGRAM  04/13/2023   COVID-19 Vaccine (4 - 2024-25 season) 10/19/2023   Acute Concerns: None.   Chronic Issues: Hypertension Pt is on Lisinopril 40 mg once daily and HCTZ 12.5 mg once daily. Good compliance and tolerance reported.  Denies any peripheral edema.   Hyperlipidemia She is taking Atorvastatin 20 mg once daily.  Tolerating well, no side effects reported.  No acute concerns.   Obesity She is on Zepbound 5 mg once weekly; doing vials. Is tolerating Zepbound well overall, some belching.  States she will take her 4th dose this week.  Her new insurance is not covering.  Would like to stay at current dose, is trying to maintain her weight.  Is not overeating and food noise has improved.   Health Maintenance: Immunizations -- UTD Colonoscopy -- UTD, last done 07/30/2023. Results were normal. Next due 07/29/2033.  Mammogram -- Last done 04/12/21.  PAP -- Last done 04/05/2021. Results were NILM.  Bone Density -- N/A Diet -- Has been working on diet. Not overeating.  Exercise -- As able.   Sleep habits -- No concerns.  Mood -- Stable.   UTD with dentist? - Yes UTD with eye doctor? - Yes  Weight history: Wt Readings from Last 10 Encounters:  08/21/23 189 lb 8 oz (86 kg)  07/30/23 190 lb (86.2 kg)  06/29/23 190 lb (86.2 kg)  04/20/23 206 lb 8 oz (93.7 kg)  01/02/23 210 lb (95.3 kg)  11/07/22 216 lb (98 kg)  05/01/22 200 lb (90.7 kg)  04/18/22 200 lb (90.7 kg)  12/27/21 204 lb (92.5 kg)  12/17/21 202 lb 9.6 oz (91.9 kg)   There is no height or weight on file to calculate BMI. No LMP recorded. (Menstrual status: Irregular Periods).  Alcohol use:  reports current alcohol use of about 3.0 standard  drinks of alcohol per week.  Tobacco use:  Tobacco Use: Low Risk  (08/21/2023)   Patient History    Smoking Tobacco Use: Never    Smokeless Tobacco Use: Never    Passive Exposure: Not on file   Eligible for lung cancer screening? no     08/21/2023    1:05 PM  Depression screen PHQ 2/9  Decreased Interest 0  Down, Depressed, Hopeless 0  PHQ - 2 Score 0     Other providers/specialists: Patient Care Team: Jarold Motto, [place] as PCP - General (Physician Assistant)    PMHx, SurgHx, SocialHx, Medications, and Allergies were reviewed in the Visit Navigator and updated as appropriate.   Past Medical History:  Diagnosis Date   Allergy    Arthritis    History of chicken pox    History of shingles    Hypercholesteremia    Hypertension    Iron deficiency anemia    Ablation; did require transfusions     Past Surgical History:  Procedure Laterality Date   CESAREAN SECTION  2002, 2004   CHOLECYSTECTOMY N/A 12/07/2020   Procedure: LAPAROSCOPIC CHOLECYSTECTOMY WITH INTRAOPERATIVE CHOLANGIOGRAM;  Surgeon: Darnell Level, MD;  Location: [facility];  Service: General;  Laterality: N/A;  ROOM 1 STARTING AT 07:30AM FOR 90 MIN   KNEE SURGERY  2012   3 surgeries  Family History  Problem Relation Age of Onset   Hypertension Mother    Breast cancer Mother    Colon cancer Mother    Congestive Heart Failure Mother    Osteoarthritis Father    Hypertension Father    Skin cancer Brother    Lung cancer Maternal Grandmother    Kidney cancer Paternal Grandmother    Colon polyps Neg Hx    Esophageal cancer Neg Hx    Rectal cancer Neg Hx    Stomach cancer Neg Hx     Social History   Tobacco Use   Smoking status: Never   Smokeless tobacco: Never  Vaping Use   Vaping status: Never Used  Substance Use Topics   Alcohol use: Yes    Alcohol/week: 3.0 standard drinks of alcohol    Types: 3 Glasses of wine per week    Comment: weekends only   Drug use: Never    Review of Systems:    Review of Systems  Constitutional:  Negative for chills, fever, malaise/fatigue and weight loss.  HENT:  Negative for hearing loss, sinus pain and sore throat.   Respiratory:  Negative for cough and hemoptysis.   Cardiovascular:  Negative for chest pain, palpitations, leg swelling and PND.  Gastrointestinal:  Negative for abdominal pain, constipation, diarrhea, heartburn, nausea and vomiting.  Genitourinary:  Negative for dysuria, frequency and urgency.  Musculoskeletal:  Negative for back pain, myalgias and neck pain.  Skin:  Negative for itching and rash.  Neurological:  Negative for dizziness, tingling, seizures and headaches.  Endo/Heme/Allergies:  Negative for polydipsia.  Psychiatric/Behavioral:  Negative for depression. The patient is not nervous/anxious.      Objective:   There were no vitals taken for this visit. There is no height or weight on file to calculate BMI.   General Appearance:    Alert, cooperative, no distress, appears stated age  Head:    Normocephalic, without obvious abnormality, atraumatic  Eyes:    PERRL, conjunctiva/corneas clear, EOM's intact, fundi    benign, both eyes  Ears:    Normal TM's and external ear canals, both ears  Nose:   Nares normal, septum midline, mucosa normal, no drainage    or sinus tenderness  Throat:   Lips, mucosa, and tongue normal; teeth and gums normal  Neck:   Supple, symmetrical, trachea midline, no adenopathy;    thyroid:  no enlargement/tenderness/nodules; no carotid   bruit or JVD  Back:     Symmetric, no curvature, ROM normal, no CVA tenderness  Lungs:     Clear to auscultation bilaterally, respirations unlabored  Chest Wall:    No tenderness or deformity   Heart:    Regular rate and rhythm, S1 and S2 normal, no murmur, rub or gallop  Breast Exam:    Deferred  Abdomen:     Soft, non-tender, bowel sounds active all four quadrants,    no masses, no organomegaly  Genitalia:    Deferred  Extremities:   Extremities  normal, atraumatic, no cyanosis or edema  Pulses:   2+ and symmetric all extremities  Skin:   Skin color, texture, turgor normal, no rashes or lesions  Lymph nodes:   Cervical, supraclavicular, and axillary nodes normal  Neurologic:   CNII-XII intact, normal strength, sensation and reflexes    throughout    Assessment/Plan:   Routine physical examination Today patient counseled on age appropriate routine health concerns for screening and prevention, each reviewed and up to date or declined. Immunizations reviewed and  up to date or declined. Labs ordered and reviewed. Risk factors for depression reviewed and negative. Hearing function and visual acuity are intact. ADLs screened and addressed as needed. Functional ability and level of safety reviewed and appropriate. Education, counseling and referrals performed based on assessed risks today. Patient provided with a copy of personalized plan for preventive services.  Obesity, unspecified class, unspecified obesity type, unspecified whether serious comorbidity present Continue efforts at exercise and healthy eating We will send in Zepbound vials -- continue 5 mg weekly Follow-up in 6 month(s), sooner if concerns  Primary hypertension Normotensive She is interested in de-prescribing Will decrease lisinopril to 30 mg daily Continue hydrochloroTHIAZIDE 12.5 mg daily Follow-up in 6 month(s), sooner if concerns I have asked her to let us know if her blood pressure increases >130/80 and we will likely resume lisinopril 40 mg nightly  Hyperlipidemia, unspecified hyperlipidemia type Update lipid panel and provide recommendations to Lipitor 20 mg daily  Vitamin D deficiency Update vitamin D and provide recommendations   B12 deficiency Update B12 and provide recommendations    I, Isabelle Course, acting as a Neurosurgeon for Jarold Motto, [place]., have documented all relevant documentation on the behalf of Jarold Motto, [place], as directed by  Jarold Motto, PA while in the presence of Jarold Motto, [place].  I, Isabelle Course, have reviewed all documentation for this visit. The documentation on 12/07/23 for the exam, diagnosis, procedures, and orders are all accurate and complete.  Jarold Motto, PA-C [facility]

## 2023-12-08 ENCOUNTER — Encounter: Payer: Self-pay | Admitting: Physician Assistant

## 2023-12-08 ENCOUNTER — Ambulatory Visit (INDEPENDENT_AMBULATORY_CARE_PROVIDER_SITE_OTHER): Payer: BC Managed Care – PPO | Admitting: Physician Assistant

## 2023-12-08 VITALS — BP 110/70 | HR 74 | Temp 97.7°F | Ht 63.0 in | Wt 190.0 lb

## 2023-12-08 DIAGNOSIS — E559 Vitamin D deficiency, unspecified: Secondary | ICD-10-CM

## 2023-12-08 DIAGNOSIS — E669 Obesity, unspecified: Secondary | ICD-10-CM

## 2023-12-08 DIAGNOSIS — E785 Hyperlipidemia, unspecified: Secondary | ICD-10-CM | POA: Diagnosis not present

## 2023-12-08 DIAGNOSIS — Z0000 Encounter for general adult medical examination without abnormal findings: Secondary | ICD-10-CM

## 2023-12-08 DIAGNOSIS — E538 Deficiency of other specified B group vitamins: Secondary | ICD-10-CM

## 2023-12-08 DIAGNOSIS — I10 Essential (primary) hypertension: Secondary | ICD-10-CM

## 2023-12-08 LAB — LIPID PANEL
Cholesterol: 141 mg/dL (ref 0–200)
HDL: 57.9 mg/dL (ref >39.00)
LDL Cholesterol: 69 mg/dL (ref 0–99)
NonHDL: 82.92
Total CHOL/HDL Ratio: 2
Triglycerides: 71 mg/dL (ref 0.0–149.0)
VLDL: 14.2 mg/dL (ref 0.0–40.0)

## 2023-12-08 LAB — COMPREHENSIVE METABOLIC PANEL
ALT: 16 U/L (ref 0–35)
AST: 21 U/L (ref 0–37)
Albumin: 4.6 g/dL (ref 3.5–5.2)
Alkaline Phosphatase: 51 U/L (ref 39–117)
BUN: 13 mg/dL (ref 6–23)
CO2: 26 meq/L (ref 19–32)
Calcium: 9.6 mg/dL (ref 8.4–10.5)
Chloride: 104 meq/L (ref 96–112)
Creatinine, Ser: 0.87 mg/dL (ref 0.40–1.20)
GFR: 76.24 mL/min (ref >60.00)
Glucose, Bld: 85 mg/dL (ref 70–99)
Potassium: 4.1 meq/L (ref 3.5–5.1)
Sodium: 138 meq/L (ref 135–145)
Total Bilirubin: 0.8 mg/dL (ref 0.2–1.2)
Total Protein: 7.2 g/dL (ref 6.0–8.3)

## 2023-12-08 LAB — CBC WITH DIFFERENTIAL/PLATELET
Basophils Absolute: 0 10*3/uL (ref 0.0–0.1)
Basophils Relative: 0.6 % (ref 0.0–3.0)
Eosinophils Absolute: 0.1 10*3/uL (ref 0.0–0.7)
Eosinophils Relative: 1.7 % (ref 0.0–5.0)
HCT: 43.3 % (ref 36.0–46.0)
Hemoglobin: 14.4 g/dL (ref 12.0–15.0)
Lymphocytes Relative: 32.8 % (ref 12.0–46.0)
Lymphs Abs: 2 10*3/uL (ref 0.7–4.0)
MCHC: 33.3 g/dL (ref 30.0–36.0)
MCV: 93.7 fl (ref 78.0–100.0)
Monocytes Absolute: 0.4 10*3/uL (ref 0.1–1.0)
Monocytes Relative: 5.9 % (ref 3.0–12.0)
Neutro Abs: 3.6 10*3/uL (ref 1.4–7.7)
Neutrophils Relative %: 59 % (ref 43.0–77.0)
Platelets: 301 10*3/uL (ref 150.0–400.0)
RBC: 4.62 Mil/uL (ref 3.87–5.11)
RDW: 13 % (ref 11.5–15.5)
WBC: 6.1 10*3/uL (ref 4.0–10.5)

## 2023-12-08 LAB — VITAMIN B12: Vitamin B-12: 1147 pg/mL — ABNORMAL HIGH (ref 211–911)

## 2023-12-08 LAB — VITAMIN D 25 HYDROXY (VIT D DEFICIENCY, FRACTURES): VITD: 22.68 ng/mL — ABNORMAL LOW (ref 30.00–100.00)

## 2023-12-08 MED ORDER — ZEPBOUND 5 MG/0.5ML ~~LOC~~ SOLN: 5.0000 mg | SUBCUTANEOUS | 5 refills | Status: DC

## 2023-12-08 MED ORDER — LISINOPRIL 30 MG PO TABS: 30.0000 mg | ORAL_TABLET | Freq: Every day | ORAL | 1 refills | Status: DC

## 2023-12-09 ENCOUNTER — Encounter: Payer: Self-pay | Admitting: Physician Assistant

## 2023-12-09 ENCOUNTER — Other Ambulatory Visit: Payer: Self-pay | Admitting: Physician Assistant

## 2023-12-09 MED ORDER — VITAMIN D (ERGOCALCIFEROL) 1.25 MG (50000 UNIT) PO CAPS: 50000.0000 [IU] | ORAL_CAPSULE | ORAL | 0 refills | Status: AC

## 2024-02-06 ENCOUNTER — Other Ambulatory Visit: Payer: Self-pay | Admitting: Physician Assistant

## 2024-03-30 ENCOUNTER — Ambulatory Visit (INDEPENDENT_AMBULATORY_CARE_PROVIDER_SITE_OTHER): Admitting: Physician Assistant

## 2024-03-30 ENCOUNTER — Encounter: Payer: Self-pay | Admitting: Physician Assistant

## 2024-03-30 VITALS — BP 120/76 | HR 80 | Temp 98.4°F | Resp 18 | Ht 63.0 in | Wt 190.8 lb

## 2024-03-30 DIAGNOSIS — G8929 Other chronic pain: Secondary | ICD-10-CM

## 2024-03-30 DIAGNOSIS — M5441 Lumbago with sciatica, right side: Secondary | ICD-10-CM | POA: Diagnosis not present

## 2024-03-30 DIAGNOSIS — E669 Obesity, unspecified: Secondary | ICD-10-CM | POA: Diagnosis not present

## 2024-03-30 MED ORDER — TIRZEPATIDE-WEIGHT MANAGEMENT 10 MG/0.5ML ~~LOC~~ SOLN: 10.0000 mg | SUBCUTANEOUS | 2 refills | Status: DC

## 2024-03-30 NOTE — Progress Notes (Signed)
 Christine Webb is a 53 y.o. female here for a follow up of a pre-existing problem.  History of Present Illness:   Chief Complaint  Patient presents with   Medical Management of Chronic Issues    BACK PAIN (ON GOING)    HPI  Back pain Per pt, she received an injection in her spine last year with good clinical effect. She saw Dr Artist Lloyd in May 2024 and was referred for epidural injection. She now reports shooting pain from her right-sided lower back that radiates to her right leg. She attributes this pain to the injection wearing off. She also reports moving a chair while at the beach last month triggered back pain. She reports minimal clinical effect with Ibuprofen, but endorses stretching and using a heat pack. She does report good clinical effect with dry needling. Pt would like a referral for PT. Denies urinary symptom(s)   Obesity Pt is on Zepbound  5 mg weekly; doing vials. She reports she has been maintaining her weight for the past 6 months and would like to increase her dose. She is agreeable to starting Zepbound  10 mg weekly.  Past Medical History:  Diagnosis Date   Allergy    Arthritis    History of chicken pox    History of shingles    Hypercholesteremia    Hypertension    Iron deficiency anemia    Ablation; did require transfusions     Social History   Tobacco Use   Smoking status: Never   Smokeless tobacco: Never  Vaping Use   Vaping status: Never Used  Substance Use Topics   Alcohol use: Yes    Alcohol/week: 3.0 standard drinks of alcohol    Types: 3 Glasses of wine per week    Comment: weekends only   Drug use: Never    Past Surgical History:  Procedure Laterality Date   CESAREAN SECTION  2002, 2004   CHOLECYSTECTOMY N/A 12/07/2020   Procedure: LAPAROSCOPIC CHOLECYSTECTOMY WITH INTRAOPERATIVE CHOLANGIOGRAM;  Surgeon: Eletha Boas, MD;  Location: [facility];  Service: General;  Laterality: N/A;  ROOM 1 STARTING AT 07:30AM FOR 90 MIN   KNEE SURGERY   2012   3 surgeries    Family History  Problem Relation Age of Onset   Hypertension Mother    Breast cancer Mother    Colon cancer Mother    Congestive Heart Failure Mother    Osteoarthritis Father    Hypertension Father    Skin cancer Brother    Lung cancer Maternal Grandmother    Kidney cancer Paternal Grandmother    Colon polyps Neg Hx    Esophageal cancer Neg Hx    Rectal cancer Neg Hx    Stomach cancer Neg Hx     Allergies  Allergen Reactions   Morphine And Codeine     Pt preference - makes pt really itchy     Current Medications:   Current Outpatient Medications:    atorvastatin  (LIPITOR) 20 MG tablet, TAKE 1 TABLET BY MOUTH EVERY DAY, Disp: 90 tablet, Rfl: 0   cetirizine (ZYRTEC) 10 MG tablet, Take 10 mg by mouth daily as needed for allergies., Disp: , Rfl:    cyanocobalamin  (VITAMIN B12) 1000 MCG tablet, Take 1,000 mcg by mouth daily., Disp: , Rfl:    hydrochlorothiazide  (MICROZIDE ) 12.5 MG capsule, TAKE 1 CAPSULE BY MOUTH EVERY DAY, Disp: 90 capsule, Rfl: 0   lisinopril  (ZESTRIL ) 30 MG tablet, Take 1 tablet (30 mg total) by mouth daily., Disp: 90 tablet, Rfl:  1   meclizine  (ANTIVERT ) 12.5 MG tablet, Take 1 tablet (12.5 mg total) by mouth 3 (three) times daily as needed for dizziness., Disp: 30 tablet, Rfl: 0   tirzepatide  10 MG/0.5ML injection vial, Inject 10 mg into the skin once a week., Disp: 2 mL, Rfl: 2   VITAMIN D , CHOLECALCIFEROL, PO, Take 1 capsule by mouth daily in the afternoon., Disp: , Rfl:    Vitamin D , Ergocalciferol , (DRISDOL ) 1.25 MG (50000 UNIT) CAPS capsule, Take 1 capsule (50,000 Units total) by mouth every 7 (seven) days., Disp: 12 capsule, Rfl: 0   Review of Systems:   Negative unless otherwise specified per HPI.  Vitals:   Vitals:   03/30/24 0826  BP: 120/76  Pulse: 80  Resp: 18  Temp: 98.4 F (36.9 C)  TempSrc: Temporal  SpO2: 96%  Weight: 190 lb 12.8 oz (86.5 kg)  Height: 5' 3 (1.6 m)     Body mass index is 33.8  kg/m.  Physical Exam:   Physical Exam Constitutional:      Appearance: Normal appearance. She is well-developed.  HENT:     Head: Normocephalic and atraumatic.  Eyes:     General: Lids are normal.     Extraocular Movements: Extraocular movements intact.     Conjunctiva/sclera: Conjunctivae normal.  Pulmonary:     Effort: Pulmonary effort is normal.  Musculoskeletal:        General: Normal range of motion.     Cervical back: Normal range of motion and neck supple.  Skin:    General: Skin is warm and dry.  Neurological:     Mental Status: She is alert and oriented to person, place, and time.  Psychiatric:        Attention and Perception: Attention and perception normal.        Mood and Affect: Mood normal.        Behavior: Behavior normal.        Thought Content: Thought content normal.        Judgment: Judgment normal.     Assessment and Plan:   1. Chronic right-sided low back pain with right-sided sciatica (Primary) - Ambulatory referral to Physical Therapy  No red flags Referral to physical therapy -- has done well with this in the past Consider close follow up with Dr Joane for further evaluation if no improvement of symptom(s)   2. Obesity, unspecified class, unspecified obesity type, unspecified whether serious comorbidity present   Continue efforts at healthy diet and exercise Will increase Zepbound  to 10 mg weekly per patient request If she has issues with tolerance, she will let us  know   I, Lavern Simmers, acting as a Neurosurgeon for [employer], [place]., have documented all relevant documentation on the behalf of Lucie Buttner, [place], as directed by Lucie Buttner, PA while in the presence of Lucie Buttner, [place].  I, Lucie Buttner, [place], have reviewed all documentation for this visit. The documentation on 03/30/24 for the exam, diagnosis, procedures, and orders are all accurate and complete.  Lucie Buttner, PA-C

## 2024-04-14 ENCOUNTER — Ambulatory Visit (INDEPENDENT_AMBULATORY_CARE_PROVIDER_SITE_OTHER): Admitting: Physical Therapy

## 2024-04-14 DIAGNOSIS — M25551 Pain in right hip: Secondary | ICD-10-CM

## 2024-04-14 DIAGNOSIS — M5459 Other low back pain: Secondary | ICD-10-CM

## 2024-04-14 NOTE — Patient Instructions (Signed)

## 2024-04-14 NOTE — Therapy (Signed)
 OUTPATIENT PHYSICAL THERAPY THORACOLUMBAR TREATMENT    Patient Name: Christine Webb MRN: [id number] DOB:[birth year], 53 y.o., female Today's Date: 04/14/24  END OF SESSION:  PT End of Session - 04/17/24 1936     Visit Number 1    Number of Visits 16    Date for PT Re-Evaluation 06/09/24    Authorization Type BCBS    PT Start Time 1433    PT Stop Time 1515    PT Time Calculation (min) 42 min    Activity Tolerance Patient tolerated treatment well    Behavior During Therapy [facility] for tasks assessed/performed              Past Medical History:  Diagnosis Date   Allergy    Arthritis    History of chicken pox    History of shingles    Hypercholesteremia    Hypertension    Iron deficiency anemia    Ablation; did require transfusions   Past Surgical History:  Procedure Laterality Date   CESAREAN SECTION  2002, 2004   CHOLECYSTECTOMY N/A 12/07/2020   Procedure: LAPAROSCOPIC CHOLECYSTECTOMY WITH INTRAOPERATIVE CHOLANGIOGRAM;  Surgeon: Eletha Boas, MD;  Location: [facility];  Service: General;  Laterality: N/A;  ROOM 1 STARTING AT 07:30AM FOR 90 MIN   KNEE SURGERY  2012   3 surgeries   Patient Active Problem List   Diagnosis Date Noted   Chronic vertigo 11/01/2021   Hypertension     PCP: Lucie Buttner  REFERRING PROVIDER: Lucie Buttner  REFERRING DIAG: Sciatica, right side  Rationale for Evaluation and Treatment: Rehabilitation  THERAPY DIAG:  Pain in right hip  Other low back pain  ONSET DATE: 3 years ago  SUBJECTIVE:                                                                                                                                                                                           SUBJECTIVE STATEMENT Pt seen previously in PT about 2 years ago with pain in R low back, into R LE. She ended up having an Injection in April of 2024, which she got good relief from, until May of 2025. States increased pain since then. . She Now has most pain in R  glute , but can radiate into R LE at times.  Pain worse with standing. Better with sititng. She has also had 2 instances of backlocking up when bending in the last year.  Previous R knee pain - Skiing accident with  fx in tibia, acl repair and knee scope  PERTINENT HISTORY: none  PAIN:  Are you having pain? Yes: NPRS scale: up to 5/10 Pain  location: R SI, down R LE/posteriorly  Pain description: Sore Aggravating factors: standing  Relieving factors: movement  PRECAUTIONS: None  WEIGHT BEARING RESTRICTIONS: No  FALLS:  Has patient fallen in last 6 months? NO    PLOF: Independent  PATIENT GOALS: Decreased pain in R glue and LE with standing    OBJECTIVE:   DIAGNOSTIC FINDINGS:    PATIENT SURVEYS:    COGNITION: Overall cognitive status: Within functional limits for tasks assessed     SENSATION: WFL  POSTURE:  WFL  PALPATION: Back: pain at R glute med.  Less in piriformis,  Sore along sacral border.   LUMBAR ROM:   AROM eval  Flexion wfl  Extension Mild limitation/ pain   Right lateral flexion wfl  Left lateral flexion wfl  Right rotation   Left rotation    (Blank rows = not tested)  LOWER EXTREMITY ROM:     Hips: WFL,  Knees: WFL  LOWER EXTREMITY MMT:    MMT Right eval Left eval  Hip flexion 4+ 4+  Hip extension    Hip abduction 4 4+  Hip adduction    Hip internal rotation    Hip external rotation    Knee flexion 5 5  Knee extension 5 5  Ankle dorsiflexion    Ankle plantarflexion    Ankle inversion    Ankle eversion     (Blank rows = not tested)  LUMBAR SPECIAL TESTS:   Non Painful stork on R.    TODAY'S TREATMENT:                                                                                                                              DATE:   04/14/2024 Therapeutic Exercise:  reviewed hip and glute stretches from previous HEP Aerobic:  Supine:   S/L:   Seated:   Standing:   Stretches:  Neuromuscular Re-education: Manual  Therapy: DTM /TPR to R piriformis, glute med, min, and sacral border ;  long leg distraction on R. Self Care: Modalites: moist heat pack x 10 min after needling.  Trigger Point Dry Needling  Initial Treatment: Pt instructed on Dry Needling rational, procedures, and possible side effects. Pt instructed to expect mild to moderate muscle soreness later in the day and/or into the next day.  Pt instructed in methods to reduce muscle soreness. Pt instructed to continue prescribed HEP. Patient was educated on signs and symptoms of infection and other risk factors and advised to seek medical attention should they occur.  Patient verbalized understanding of these instructions and education.   Patient Verbal Consent Given: Yes Education Handout Provided: Yes Muscles Treated: R glute med, min Electrical Stimulation Performed: No Treatment Response/Outcome: palpable increase in muscle length, Twitch response.      PATIENT EDUCATION:  Education details: updated and reviewed HEP for R glute pain  Person educated: Patient Education method: Explanation, Demonstration, Tactile cues, Verbal cues, and Handouts Education comprehension: verbalized understanding, returned demonstration, verbal  cues required, tactile cues required, and needs further education  HOME EXERCISE PROGRAM: Access Code: [email or web address]   ASSESSMENT:  CLINICAL IMPRESSION:  Eval: Pt presents with primary complaint of increased pain in R glute and into R LE. She has increased soreness in low back/LE with standing and walking and has limited tolerance for this. She has increased pain with lumbar extension as well. Pt with tightness and tenderness in R glute musculature today with palpation and testing. Good twitch and tolerance for dry needling today. Reviewed ther ex for lumbar and hip mobility from previous HEP. Pt with increased pain that is limiting ability for functional activity. Pt to benefit from skilled PT to improve deficits and  pain.    OBJECTIVE IMPAIRMENTS: Abnormal gait, decreased activity tolerance, decreased mobility, decreased ROM, decreased strength, increased muscle spasms, improper body mechanics, and pain.   ACTIVITY LIMITATIONS: lifting, bending, standing, squatting, stairs, and locomotion level  PARTICIPATION LIMITATIONS: cleaning, laundry, shopping, community activity, and occupation  PERSONAL FACTORS: Time since onset of injury/illness/exacerbation are also affecting patient's functional outcome.   REHAB POTENTIAL: Good  CLINICAL DECISION MAKING: Stable/uncomplicated  EVALUATION COMPLEXITY: Low   GOALS: Goals reviewed with patient? Yes  SHORT TERM GOALS: Target date: 05/05/2024   Pt to be independent with initial HEP  Goal status: INITIAL  2.  Pt to demo decreased muscle tension in R glute by at least 50 %  Goal status: INITIAL   LONG TERM GOALS: Target date: 06/09/2024    Pt to be independent with final HEP for ankle, knee and back pain  Goal status: INITIAL  2.  Pt to report decreased pain at R low back, glue and  R LE to 0-2/10 to improve ability for functional and community activity   Goal status: INITIAL  3.  Pt to tolerate at least 15 min of standing without pain greater than 2/10   Goal status: INITIAL  4. Pt to demo ability and optimal mechanics for bend, lift, squat, to improve pain in low back with IADLS.     Goal status: INITIAL    PLAN:  PT FREQUENCY: 1-2x/week  PT DURATION: 8 weeks  PLANNED INTERVENTIONS: Therapeutic exercises, Therapeutic activity, Neuromuscular re-education, Balance training, Gait training, Patient/Family education, Self Care, Joint mobilization, Joint manipulation, Stair training, Orthotic/Fit training, DME instructions, Dry Needling, Electrical stimulation, Spinal manipulation, Spinal mobilization, Cryotherapy, Moist heat, Taping, Traction, Ultrasound, Ionotophoresis 4mg /ml Dexamethasone , and Manual therapy.  PLAN FOR NEXT SESSION:    Tinnie Don, PT, DPT 7:38 PM  04/17/24

## 2024-04-17 ENCOUNTER — Encounter: Payer: Self-pay | Admitting: Physical Therapy

## 2024-05-10 ENCOUNTER — Other Ambulatory Visit: Payer: Self-pay | Admitting: Physician Assistant

## 2024-05-12 ENCOUNTER — Ambulatory Visit (INDEPENDENT_AMBULATORY_CARE_PROVIDER_SITE_OTHER): Admitting: Physical Therapy

## 2024-05-12 ENCOUNTER — Encounter: Payer: Self-pay | Admitting: Physical Therapy

## 2024-05-12 DIAGNOSIS — M5459 Other low back pain: Secondary | ICD-10-CM

## 2024-05-12 DIAGNOSIS — M25551 Pain in right hip: Secondary | ICD-10-CM

## 2024-05-12 NOTE — Therapy (Signed)
 OUTPATIENT PHYSICAL THERAPY THORACOLUMBAR TREATMENT    Patient Name: Christine Webb MRN: [id number] DOB:11/10/1970, 53 y.o., female Today's Date: 05/12/24  END OF SESSION:  PT End of Session - 05/12/24 1515     Visit Number 2    Number of Visits 16    Date for PT Re-Evaluation 06/09/24    Authorization Type BCBS    PT Start Time 1517    PT Stop Time 1600    PT Time Calculation (min) 43 min    Activity Tolerance Patient tolerated treatment well    Behavior During Therapy [facility] for tasks assessed/performed              Past Medical History:  Diagnosis Date   Allergy    Arthritis    History of chicken pox    History of shingles    Hypercholesteremia    Hypertension    Iron deficiency anemia    Ablation; did require transfusions   Past Surgical History:  Procedure Laterality Date   CESAREAN SECTION  2002, 2004   CHOLECYSTECTOMY N/A 12/07/2020   Procedure: LAPAROSCOPIC CHOLECYSTECTOMY WITH INTRAOPERATIVE CHOLANGIOGRAM;  Surgeon: Eletha Boas, MD;  Location: [facility];  Service: General;  Laterality: N/A;  ROOM 1 STARTING AT 07:30AM FOR 90 MIN   KNEE SURGERY  2012   3 surgeries   Patient Active Problem List   Diagnosis Date Noted   Chronic vertigo 11/01/2021   Hypertension     PCP: Lucie Buttner  REFERRING PROVIDER: Lucie Buttner  REFERRING DIAG: Sciatica, right side  Rationale for Evaluation and Treatment: Rehabilitation  THERAPY DIAG:  Other low back pain  Pain in right hip  ONSET DATE: 3 years ago  SUBJECTIVE:                                                                                                                                                                                           SUBJECTIVE STATEMENT Pt reports that she had decreased pain in glute following dry needling and last session. She continues to have mild pain in glute, but decreased from previously. She also continues to have mild soreness in low back and SI region.   Eval: Pt  seen previously in PT about 2 years ago with pain in R low back, into R LE. She ended up having an Injection in April of 2024, which she got good relief from, until May of 2025. States increased pain since then. . She Now has most pain in R glute , but can radiate into R LE at times.  Pain worse with standing. Better with sititng. She has also had 2 instances of backlocking  up when bending in the last year.  Previous R knee pain - Skiing accident with  fx in tibia, acl repair and knee scope  PERTINENT HISTORY: none  PAIN:  Are you having pain? Yes: NPRS scale: up to 5/10 Pain location: R SI, down R LE/posteriorly  Pain description: Sore Aggravating factors: standing  Relieving factors: movement  PRECAUTIONS: None  WEIGHT BEARING RESTRICTIONS: No  FALLS:  Has patient fallen in last 6 months? NO    PLOF: Independent  PATIENT GOALS: Decreased pain in R glue and LE with standing    OBJECTIVE:   DIAGNOSTIC FINDINGS:    PATIENT SURVEYS:    COGNITION: Overall cognitive status: Within functional limits for tasks assessed     SENSATION: WFL  POSTURE:  WFL  PALPATION: Back: pain at R glute med.  Less in piriformis,  Sore along sacral border.   LUMBAR ROM:   AROM eval  Flexion wfl  Extension Mild limitation/ pain   Right lateral flexion wfl  Left lateral flexion wfl  Right rotation   Left rotation    (Blank rows = not tested)  LOWER EXTREMITY ROM:     Hips: WFL,  Knees: WFL  LOWER EXTREMITY MMT:    MMT Right eval Left eval  Hip flexion 4+ 4+  Hip extension    Hip abduction 4 4+  Hip adduction    Hip internal rotation    Hip external rotation    Knee flexion 5 5  Knee extension 5 5  Ankle dorsiflexion    Ankle plantarflexion    Ankle inversion    Ankle eversion     (Blank rows = not tested)  LUMBAR SPECIAL TESTS:   Non Painful stork on R.    TODAY'S TREATMENT:                                                                                                                               DATE:   05/12/2024 Therapeutic Exercise:  Aerobic:  Supine:  SLR with TA  x 10 bil;  bridging 2 x 10;  Supine March x 15  S/L:   Seated:   Standing:   Stretches:  SKTC x 3 bil;  piriformis x 3 bil;  Neuromuscular Re-education: Manual Therapy: DTM /TPR to R piriformis, glute med, min,   long leg distraction on R. Self Care: Modalites:  Trigger Point Dry Needling  Subsequent Treatment: Instructions provided previously at initial dry needling treatment.  Instructions reviewed, if requested by the patient, prior to subsequent dry needling treatment.   Patient Verbal Consent Given: Yes Education Handout Provided: Yes Muscles Treated: R glute med, piriformis  Electrical Stimulation Performed: No Treatment Response/Outcome: palpable increase in muscle length, Twitch response.      PATIENT EDUCATION:  Education details: updated and reviewed HEP for R glute pain  Person educated: Patient Education method: Explanation, Demonstration, Tactile cues, Verbal cues, and Handouts Education comprehension: verbalized understanding, returned demonstration, verbal cues  required, tactile cues required, and needs further education  HOME EXERCISE PROGRAM: Access Code: [email or web address]   ASSESSMENT:  CLINICAL IMPRESSION: Repeated dry needling for glute, due to positive response and decreased pain last visit. Pt with good tolerance. Reviewed importance of strength and stabilization if pain is improving. Started initial strength today. Pt to benefit from continued care.    Eval: Pt presents with primary complaint of increased pain in R glute and into R LE. She has increased soreness in low back/LE with standing and walking and has limited tolerance for this. She has increased pain with lumbar extension as well. Pt with tightness and tenderness in R glute musculature today with palpation and testing. Good twitch and tolerance for dry needling today. Reviewed ther ex for  lumbar and hip mobility from previous HEP. Pt with increased pain that is limiting ability for functional activity. Pt to benefit from skilled PT to improve deficits and pain.    OBJECTIVE IMPAIRMENTS: Abnormal gait, decreased activity tolerance, decreased mobility, decreased ROM, decreased strength, increased muscle spasms, improper body mechanics, and pain.   ACTIVITY LIMITATIONS: lifting, bending, standing, squatting, stairs, and locomotion level  PARTICIPATION LIMITATIONS: cleaning, laundry, shopping, community activity, and occupation  PERSONAL FACTORS: Time since onset of injury/illness/exacerbation are also affecting patient's functional outcome.   REHAB POTENTIAL: Good  CLINICAL DECISION MAKING: Stable/uncomplicated  EVALUATION COMPLEXITY: Low   GOALS: Goals reviewed with patient? Yes  SHORT TERM GOALS: Target date: 05/05/2024   Pt to be independent with initial HEP  Goal status: INITIAL  2.  Pt to demo decreased muscle tension in R glute by at least 50 %  Goal status: INITIAL   LONG TERM GOALS: Target date: 06/09/2024    Pt to be independent with final HEP for ankle, knee and back pain  Goal status: INITIAL  2.  Pt to report decreased pain at R low back, glue and  R LE to 0-2/10 to improve ability for functional and community activity   Goal status: INITIAL  3.  Pt to tolerate at least 15 min of standing without pain greater than 2/10   Goal status: INITIAL  4. Pt to demo ability and optimal mechanics for bend, lift, squat, to improve pain in low back with IADLS.     Goal status: INITIAL    PLAN:  PT FREQUENCY: 1-2x/week  PT DURATION: 8 weeks  PLANNED INTERVENTIONS: Therapeutic exercises, Therapeutic activity, Neuromuscular re-education, Balance training, Gait training, Patient/Family education, Self Care, Joint mobilization, Joint manipulation, Stair training, Orthotic/Fit training, DME instructions, Dry Needling, Electrical stimulation, Spinal  manipulation, Spinal mobilization, Cryotherapy, Moist heat, Taping, Traction, Ultrasound, Ionotophoresis 4mg /ml Dexamethasone , and Manual therapy.  PLAN FOR NEXT SESSION:   Tinnie Don, PT, DPT 3:15 PM  05/12/24

## 2024-05-16 ENCOUNTER — Ambulatory Visit (INDEPENDENT_AMBULATORY_CARE_PROVIDER_SITE_OTHER): Admitting: Physical Therapy

## 2024-05-16 DIAGNOSIS — M25551 Pain in right hip: Secondary | ICD-10-CM | POA: Diagnosis not present

## 2024-05-16 DIAGNOSIS — M5459 Other low back pain: Secondary | ICD-10-CM

## 2024-05-16 NOTE — Therapy (Unsigned)
 OUTPATIENT PHYSICAL THERAPY THORACOLUMBAR TREATMENT    Patient Name: Christine Webb MRN: [id number] DOB:10-Aug-1971, 53 y.o., female Today's Date: 05/16/24  END OF SESSION:  PT End of Session - 05/16/24 1540     Visit Number 3    Number of Visits 16    Date for PT Re-Evaluation 06/09/24    Authorization Type BCBS    PT Start Time 1522    PT Stop Time 1600    PT Time Calculation (min) 38 min    Activity Tolerance Patient tolerated treatment well    Behavior During Therapy [facility] for tasks assessed/performed               Past Medical History:  Diagnosis Date   Allergy    Arthritis    History of chicken pox    History of shingles    Hypercholesteremia    Hypertension    Iron deficiency anemia    Ablation; did require transfusions   Past Surgical History:  Procedure Laterality Date   CESAREAN SECTION  2002, 2004   CHOLECYSTECTOMY N/A 12/07/2020   Procedure: LAPAROSCOPIC CHOLECYSTECTOMY WITH INTRAOPERATIVE CHOLANGIOGRAM;  Surgeon: Eletha Boas, MD;  Location: [facility];  Service: General;  Laterality: N/A;  ROOM 1 STARTING AT 07:30AM FOR 90 MIN   KNEE SURGERY  2012   3 surgeries   Patient Active Problem List   Diagnosis Date Noted   Chronic vertigo 11/01/2021   Hypertension     PCP: Lucie Buttner  REFERRING PROVIDER: Lucie Buttner  REFERRING DIAG: Sciatica, right side  Rationale for Evaluation and Treatment: Rehabilitation  THERAPY DIAG:  Other low back pain  Pain in right hip  ONSET DATE: 3 years ago  SUBJECTIVE:                                                                                                                                                                                           SUBJECTIVE STATEMENT Pt reports decreased pain in glute .She continues to have mild soreness,  but decreased from previously.   Eval: Pt seen previously in PT about 2 years ago with pain in R low back, into R LE. She ended up having an Injection in April of  2024, which she got good relief from, until May of 2025. States increased pain since then. . She Now has most pain in R glute , but can radiate into R LE at times.  Pain worse with standing. Better with sititng. She has also had 2 instances of backlocking up when bending in the last year.  Previous R knee pain - Skiing accident with  fx in tibia, acl repair  and knee scope  PERTINENT HISTORY: none  PAIN:  Are you having pain? Yes: NPRS scale: up to 5/10 Pain location: R SI, down R LE/posteriorly  Pain description: Sore Aggravating factors: standing  Relieving factors: movement  PRECAUTIONS: None  WEIGHT BEARING RESTRICTIONS: No  FALLS:  Has patient fallen in last 6 months? NO    PLOF: Independent  PATIENT GOALS: Decreased pain in R glue and LE with standing    OBJECTIVE:   DIAGNOSTIC FINDINGS:    PATIENT SURVEYS:    COGNITION: Overall cognitive status: Within functional limits for tasks assessed     SENSATION: WFL  POSTURE:  WFL  PALPATION: Back: pain at R glute med.  Less in piriformis,  Sore along sacral border.   LUMBAR ROM:   AROM eval  Flexion wfl  Extension Mild limitation/ pain   Right lateral flexion wfl  Left lateral flexion wfl  Right rotation   Left rotation    (Blank rows = not tested)  LOWER EXTREMITY ROM:     Hips: WFL,  Knees: WFL  LOWER EXTREMITY MMT:    MMT Right eval Left eval  Hip flexion 4+ 4+  Hip extension    Hip abduction 4 4+  Hip adduction    Hip internal rotation    Hip external rotation    Knee flexion 5 5  Knee extension 5 5  Ankle dorsiflexion    Ankle plantarflexion    Ankle inversion    Ankle eversion     (Blank rows = not tested)  LUMBAR SPECIAL TESTS:   Non Painful stork on R.    TODAY'S TREATMENT:                                                                                                                              DATE:   05/16/2024 Therapeutic Exercise:  Aerobic:  Supine:  SLR with TA  x  10 bil;   bridging with ball squeeze 2 x 10;  Supine March with TA  x 15  S/L:  clams 2 x 10 on R;  abd 2 x 10 bil;  Seated:   Standing:  Step up 6 in x 10 bil;- no pain  Stretches:  SKTC x 3 bil;  seated piriformis x 3 bil;   Neuromuscular Re-education: Manual Therapy:   DTM /TPR to R piriformis, glute med,  with tennis ball,  long leg distraction  bil for lumbar  Self Care: Modalites:     PATIENT EDUCATION:  Education details: updated and reviewed HEP for R glute pain  Person educated: Patient Education method: Explanation, Demonstration, Tactile cues, Verbal cues, and Handouts Education comprehension: verbalized understanding, returned demonstration, verbal cues required, tactile cues required, and needs further education  HOME EXERCISE PROGRAM: Access Code: [email or web address]   ASSESSMENT:  CLINICAL IMPRESSION: Pt with improving pain. She does have tenderness in R glute with DTM today. Improved ability for strengthening without pain, will benefit from continued strength and  stability progression. Mild soreness in glute with bridging but not with other strengthening. Discussed pilates for strengthening for future/HEP.    Eval: Pt presents with primary complaint of increased pain in R glute and into R LE. She has increased soreness in low back/LE with standing and walking and has limited tolerance for this. She has increased pain with lumbar extension as well. Pt with tightness and tenderness in R glute musculature today with palpation and testing. Good twitch and tolerance for dry needling today. Reviewed ther ex for lumbar and hip mobility from previous HEP. Pt with increased pain that is limiting ability for functional activity. Pt to benefit from skilled PT to improve deficits and pain.    OBJECTIVE IMPAIRMENTS: Abnormal gait, decreased activity tolerance, decreased mobility, decreased ROM, decreased strength, increased muscle spasms, improper body mechanics, and pain.   ACTIVITY  LIMITATIONS: lifting, bending, standing, squatting, stairs, and locomotion level  PARTICIPATION LIMITATIONS: cleaning, laundry, shopping, community activity, and occupation  PERSONAL FACTORS: Time since onset of injury/illness/exacerbation are also affecting patient's functional outcome.   REHAB POTENTIAL: Good  CLINICAL DECISION MAKING: Stable/uncomplicated  EVALUATION COMPLEXITY: Low   GOALS: Goals reviewed with patient? Yes  SHORT TERM GOALS: Target date: 05/05/2024   Pt to be independent with initial HEP  Goal status: INITIAL  2.  Pt to demo decreased muscle tension in R glute by at least 50 %  Goal status: INITIAL   LONG TERM GOALS: Target date: 06/09/2024    Pt to be independent with final HEP for ankle, knee and back pain  Goal status: INITIAL  2.  Pt to report decreased pain at R low back, glue and  R LE to 0-2/10 to improve ability for functional and community activity   Goal status: INITIAL  3.  Pt to tolerate at least 15 min of standing without pain greater than 2/10   Goal status: INITIAL  4. Pt to demo ability and optimal mechanics for bend, lift, squat, to improve pain in low back with IADLS.     Goal status: INITIAL    PLAN:  PT FREQUENCY: 1-2x/week  PT DURATION: 8 weeks  PLANNED INTERVENTIONS: Therapeutic exercises, Therapeutic activity, Neuromuscular re-education, Balance training, Gait training, Patient/Family education, Self Care, Joint mobilization, Joint manipulation, Stair training, Orthotic/Fit training, DME instructions, Dry Needling, Electrical stimulation, Spinal manipulation, Spinal mobilization, Cryotherapy, Moist heat, Taping, Traction, Ultrasound, Ionotophoresis 4mg /ml Dexamethasone , and Manual therapy.  PLAN FOR NEXT SESSION:   Tinnie Don, PT, DPT 9:55 AM  05/17/24

## 2024-05-17 ENCOUNTER — Encounter: Payer: Self-pay | Admitting: Physical Therapy

## 2024-05-26 ENCOUNTER — Encounter: Payer: Self-pay | Admitting: Physical Therapy

## 2024-05-26 ENCOUNTER — Ambulatory Visit (INDEPENDENT_AMBULATORY_CARE_PROVIDER_SITE_OTHER): Admitting: Physical Therapy

## 2024-05-26 DIAGNOSIS — M25551 Pain in right hip: Secondary | ICD-10-CM

## 2024-05-26 DIAGNOSIS — M5459 Other low back pain: Secondary | ICD-10-CM

## 2024-05-26 NOTE — Therapy (Unsigned)
 OUTPATIENT PHYSICAL THERAPY THORACOLUMBAR TREATMENT    Patient Name: Christine Webb MRN: [id number] DOB:[birth year], 53 y.o., female Today's Date: 05/26/24  END OF SESSION:  PT End of Session - 05/26/24 1602     Visit Number 4    Number of Visits 16    Date for PT Re-Evaluation 06/09/24    Authorization Type BCBS    PT Start Time 1602    PT Stop Time 1643    PT Time Calculation (min) 41 min    Activity Tolerance Patient tolerated treatment well    Behavior During Therapy [facility] for tasks assessed/performed               Past Medical History:  Diagnosis Date   Allergy    Arthritis    History of chicken pox    History of shingles    Hypercholesteremia    Hypertension    Iron deficiency anemia    Ablation; did require transfusions   Past Surgical History:  Procedure Laterality Date   CESAREAN SECTION  2002, 2004   CHOLECYSTECTOMY N/A 12/07/2020   Procedure: LAPAROSCOPIC CHOLECYSTECTOMY WITH INTRAOPERATIVE CHOLANGIOGRAM;  Surgeon: Eletha Boas, MD;  Location: [facility];  Service: General;  Laterality: N/A;  ROOM 1 STARTING AT 07:30AM FOR 90 MIN   KNEE SURGERY  2012   3 surgeries   Patient Active Problem List   Diagnosis Date Noted   Chronic vertigo 11/01/2021   Hypertension     PCP: Lucie Buttner  REFERRING PROVIDER: Lucie Buttner  REFERRING DIAG: Sciatica, right side  Rationale for Evaluation and Treatment: Rehabilitation  THERAPY DIAG:  Other low back pain  Pain in right hip  ONSET DATE: 3 years ago  SUBJECTIVE:                                                                                                                                                                                           SUBJECTIVE STATEMENT Pt reports continued/mild soreness in glute. It is decreased from previously. She notes pain mostly with standing, more than 10 min or so.   Eval: Pt seen previously in PT about 2 years ago with pain in R low back, into R LE. She ended up  having an Injection in April of 2024, which she got good relief from, until May of 2025. States increased pain since then. . She Now has most pain in R glute , but can radiate into R LE at times.  Pain worse with standing. Better with sititng. She has also had 2 instances of backlocking up when bending in the last year.  Previous R knee pain - Skiing accident with  fx in tibia, acl repair and knee scope  PERTINENT HISTORY: none  PAIN:  Are you having pain? Yes: NPRS scale: up to 5/10 Pain location: R SI, down R LE/posteriorly  Pain description: Sore Aggravating factors: standing  Relieving factors: movement  PRECAUTIONS: None  WEIGHT BEARING RESTRICTIONS: No  FALLS:  Has patient fallen in last 6 months? NO    PLOF: Independent  PATIENT GOALS: Decreased pain in R glue and LE with standing    OBJECTIVE:   DIAGNOSTIC FINDINGS:    PATIENT SURVEYS:    COGNITION: Overall cognitive status: Within functional limits for tasks assessed     SENSATION: WFL  POSTURE:  WFL  PALPATION: Back: pain at R glute med.  Less in piriformis,  Sore along sacral border.   LUMBAR ROM:   AROM eval  Flexion wfl  Extension Mild limitation/ pain   Right lateral flexion wfl  Left lateral flexion wfl  Right rotation   Left rotation    (Blank rows = not tested)  LOWER EXTREMITY ROM:     Hips: WFL,  Knees: WFL  LOWER EXTREMITY MMT:    MMT Right eval Left eval  Hip flexion 4+ 4+  Hip extension    Hip abduction 4 4+  Hip adduction    Hip internal rotation    Hip external rotation    Knee flexion 5 5  Knee extension 5 5  Ankle dorsiflexion    Ankle plantarflexion    Ankle inversion    Ankle eversion     (Blank rows = not tested)  LUMBAR SPECIAL TESTS:   Non Painful stork on R.    TODAY'S TREATMENT:                                                                                                                              DATE:   05/26/2024 Therapeutic Exercise:   Aerobic:  Supine:  SLR with TA  x 10 bil;   bridging with ball squeeze 2 x 10;   Supine March GTB with TA  x 15  S/L:   clams 2 x 10 on R;  abd 2 x 10 bil;  Seated:   Standing:       Stretches:  SKTC x 3 bil;  seated piriformis x 3 bil;   Neuromuscular Re-education: Manual Therapy:   DTM /TPR to R piriformis, glute med,   long leg distraction  bil for lumbar  Self Care: Ther Act:    Step up 6 in x 10 bil(8lb unilateral hold); Ecc/lateral step downs 6 in x 15 bil;    PATIENT EDUCATION:  Education details: updated and reviewed HEP for R glute pain  Person educated: Patient Education method: Explanation, Demonstration, Tactile cues, Verbal cues, and Handouts Education comprehension: verbalized understanding, returned demonstration, verbal cues required, tactile cues required, and needs further education  HOME EXERCISE PROGRAM: Access Code: [email or web address]   ASSESSMENT:  CLINICAL IMPRESSION: Pt with improving pain. She does  have continued tenderness in R glute with DTM today. Improved ability for strengthening today, less pain with bridging.She  will benefit from continued strength and stability progression. .    Eval: Pt presents with primary complaint of increased pain in R glute and into R LE. She has increased soreness in low back/LE with standing and walking and has limited tolerance for this. She has increased pain with lumbar extension as well. Pt with tightness and tenderness in R glute musculature today with palpation and testing. Good twitch and tolerance for dry needling today. Reviewed ther ex for lumbar and hip mobility from previous HEP. Pt with increased pain that is limiting ability for functional activity. Pt to benefit from skilled PT to improve deficits and pain.    OBJECTIVE IMPAIRMENTS: Abnormal gait, decreased activity tolerance, decreased mobility, decreased ROM, decreased strength, increased muscle spasms, improper body mechanics, and pain.   ACTIVITY LIMITATIONS:  lifting, bending, standing, squatting, stairs, and locomotion level  PARTICIPATION LIMITATIONS: cleaning, laundry, shopping, community activity, and occupation  PERSONAL FACTORS: Time since onset of injury/illness/exacerbation are also affecting patient's functional outcome.   REHAB POTENTIAL: Good  CLINICAL DECISION MAKING: Stable/uncomplicated  EVALUATION COMPLEXITY: Low   GOALS: Goals reviewed with patient? Yes  SHORT TERM GOALS: Target date: 05/05/2024   Pt to be independent with initial HEP  Goal status: INITIAL  2.  Pt to demo decreased muscle tension in R glute by at least 50 %  Goal status: INITIAL   LONG TERM GOALS: Target date: 06/09/2024    Pt to be independent with final HEP for ankle, knee and back pain  Goal status: INITIAL  2.  Pt to report decreased pain at R low back, glue and  R LE to 0-2/10 to improve ability for functional and community activity   Goal status: INITIAL  3.  Pt to tolerate at least 15 min of standing without pain greater than 2/10   Goal status: INITIAL  4. Pt to demo ability and optimal mechanics for bend, lift, squat, to improve pain in low back with IADLS.     Goal status: INITIAL    PLAN:  PT FREQUENCY: 1-2x/week  PT DURATION: 8 weeks  PLANNED INTERVENTIONS: Therapeutic exercises, Therapeutic activity, Neuromuscular re-education, Balance training, Gait training, Patient/Family education, Self Care, Joint mobilization, Joint manipulation, Stair training, Orthotic/Fit training, DME instructions, Dry Needling, Electrical stimulation, Spinal manipulation, Spinal mobilization, Cryotherapy, Moist heat, Taping, Traction, Ultrasound, Ionotophoresis 4mg /ml Dexamethasone , and Manual therapy.  PLAN FOR NEXT SESSION:   Tinnie Don, PT, DPT 4:02 PM  05/26/24

## 2024-06-04 ENCOUNTER — Other Ambulatory Visit: Payer: Self-pay | Admitting: Physician Assistant

## 2024-06-15 ENCOUNTER — Encounter: Payer: Self-pay | Admitting: Physical Therapy

## 2024-06-15 ENCOUNTER — Ambulatory Visit (INDEPENDENT_AMBULATORY_CARE_PROVIDER_SITE_OTHER): Admitting: Physical Therapy

## 2024-06-15 DIAGNOSIS — M25551 Pain in right hip: Secondary | ICD-10-CM

## 2024-06-15 DIAGNOSIS — M5459 Other low back pain: Secondary | ICD-10-CM

## 2024-06-15 NOTE — Therapy (Signed)
 OUTPATIENT PHYSICAL THERAPY THORACOLUMBAR TREATMENT /Re-Cert    Patient Name: Christine Webb MRN: [id number] DOB:Aug 09, 1971, 53 y.o., female Today's Date: 06/15/24  END OF SESSION:  PT End of Session - 06/17/24 1125     Visit Number 5    Number of Visits 16    Date for Recertification  07/27/24    Authorization Type BCBS    PT Start Time 0802    PT Stop Time 0845    PT Time Calculation (min) 43 min    Activity Tolerance Patient tolerated treatment well    Behavior During Therapy [facility] for tasks assessed/performed                Past Medical History:  Diagnosis Date   Allergy    Arthritis    History of chicken pox    History of shingles    Hypercholesteremia    Hypertension    Iron deficiency anemia    Ablation; did require transfusions   Past Surgical History:  Procedure Laterality Date   CESAREAN SECTION  2002, 2004   CHOLECYSTECTOMY N/A 12/07/2020   Procedure: LAPAROSCOPIC CHOLECYSTECTOMY WITH INTRAOPERATIVE CHOLANGIOGRAM;  Surgeon: Eletha Boas, MD;  Location: [facility];  Service: General;  Laterality: N/A;  ROOM 1 STARTING AT 07:30AM FOR 90 MIN   KNEE SURGERY  2012   3 surgeries   Patient Active Problem List   Diagnosis Date Noted   Chronic vertigo 11/01/2021   Hypertension     PCP: Lucie Buttner  REFERRING PROVIDER: Lucie Buttner  REFERRING DIAG: Sciatica, right side  Rationale for Evaluation and Treatment: Rehabilitation  THERAPY DIAG:  Other low back pain  Pain in right hip  ONSET DATE: 3 years ago  SUBJECTIVE:                                                                                                                                                                                           SUBJECTIVE STATEMENT Pt reports improved pain in the last week. She notes some pain continues  with longer duration standing.   Eval: Pt seen previously in PT about 2 years ago with pain in R low back, into R LE. She ended up having an  Injection in April of 2024, which she got good relief from, until May of 2025. States increased pain since then. . She Now has most pain in R glute , but can radiate into R LE at times.  Pain worse with standing. Better with sititng. She has also had 2 instances of backlocking up when bending in the last year.  Previous R knee pain - Skiing accident with  fx in tibia, acl repair and knee scope  PERTINENT HISTORY: none  PAIN:  Are you having pain? Yes: NPRS scale: up to 5/10 Pain location: R SI, down R LE/posteriorly  Pain description: Sore Aggravating factors: standing  Relieving factors: movement  PRECAUTIONS: None  WEIGHT BEARING RESTRICTIONS: No  FALLS:  Has patient fallen in last 6 months? NO    PLOF: Independent  PATIENT GOALS: Decreased pain in R glue and LE with standing    OBJECTIVE:   DIAGNOSTIC FINDINGS:    PATIENT SURVEYS:    COGNITION: Overall cognitive status: Within functional limits for tasks assessed     SENSATION: WFL  POSTURE:  WFL  PALPATION: Back: pain at R glute med, piriformis,  Sore along sacral border.   LUMBAR ROM:   AROM eval 06/15/24  Flexion wfl WFL  Extension Mild limitation/ pain  WFL  Right lateral flexion wfl WFl  Left lateral flexion wfl WFL  Right rotation    Left rotation     (Blank rows = not tested)  LOWER EXTREMITY ROM:     Hips: WFL,  Knees: WFL  LOWER EXTREMITY MMT:    MMT Right eval Left eval Right 06/15/24  Hip flexion 4+ 4+ 4+  Hip extension     Hip abduction 4 4+ 4  Hip adduction     Hip internal rotation     Hip external rotation     Knee flexion 5 5 5   Knee extension 5 5 5   Ankle dorsiflexion     Ankle plantarflexion     Ankle inversion     Ankle eversion      (Blank rows = not tested)  LUMBAR SPECIAL TESTS:    TODAY'S TREATMENT:                                                                                                                              DATE:   06/15/2024    Therapeutic  Exercise:  Aerobic:  Supine:  SLR with TA  x 10 bil;   bridging with ball squeeze 2 x 10;   Supine March GTB with TA  x 15  S/L:   clams 2 x 10 on R;  abd 2 x 10 bil;  Seated:   Standing:       Stretches:  SKTC x 3 bil;  seated piriformis x 3 bil;   Neuromuscular Re-education: Manual Therapy:  TPR to R piriformis, glute med,   long leg distraction  bil for lumbar  Self Care: Ther Act:    Step up 6 in x 10 bil(8lb unilateral hold); Ecc/lateral step downs 6 in x 15 bil; Hip abd Ytb 3 x 6 bil;    PATIENT EDUCATION:  Education details: updated and reviewed HEP  Person educated: Patient Education method: Explanation, Demonstration, Tactile cues, Verbal cues, and Handouts Education comprehension: verbalized understanding, returned demonstration, verbal cues required, tactile cues required, and needs further education  HOME EXERCISE PROGRAM:  Access Code: [email or web address]   ASSESSMENT:  CLINICAL IMPRESSION:    Re-Cert. Pt has been seen for 5 visits. She has much decreased pain levels since Eval. She continues to have tenderness with palpation, but improved pain with activity. She has mild increase in pain with standing, but does better with walking and activity. She is challenged with strength and single leg strength/hip stability with ther ex. Pt to benefit from continued care for progressive strength and to meet LTG s . [place] is going [place] of the [place] weeks, and will return when she gets back.   Pt with improving pain. She does have continued tenderness in R glute with DTM today. Improved ability for strengthening today, less pain with bridging.She  will benefit from continued strength and stability progression. .    Eval: Pt presents with primary complaint of increased pain in R glute and into R LE. She has increased soreness in low back/LE with standing and walking and has limited tolerance for this. She has increased pain with lumbar extension as well. Pt with tightness and  tenderness in R glute musculature today with palpation and testing. Good twitch and tolerance for dry needling today. Reviewed ther ex for lumbar and hip mobility from previous HEP. Pt with increased pain that is limiting ability for functional activity. Pt to benefit from skilled PT to improve deficits and pain.    OBJECTIVE IMPAIRMENTS: Abnormal gait, decreased activity tolerance, decreased mobility, decreased ROM, decreased strength, increased muscle spasms, improper body mechanics, and pain.   ACTIVITY LIMITATIONS: lifting, bending, standing, squatting, stairs, and locomotion level  PARTICIPATION LIMITATIONS: cleaning, laundry, shopping, community activity, and occupation  PERSONAL FACTORS: Time since onset of injury/illness/exacerbation are also affecting patient's functional outcome.   REHAB POTENTIAL: Good  CLINICAL DECISION MAKING: Stable/uncomplicated  EVALUATION COMPLEXITY: Low   GOALS: Goals reviewed with patient? Yes   SHORT TERM GOALS: Target date: 05/05/2024  Pt to be independent with initial HEP  Goal status: MET  2.  Pt to demo decreased muscle tension in R glute by at least 50 %  Goal status :MET    LONG TERM GOALS: Target date: 07/27/2024   Pt to be independent with final HEP for ankle, knee and back pain  Goal status: In progress  2.  Pt to report decreased pain at R low back, glue and  R LE to 0-2/10 to improve ability for functional and community activity   Goal status: In progress  3.  Pt to tolerate at least 15 min of standing without pain greater than 2/10   Goal status:In progress  4. Pt to demo ability and optimal mechanics for bend, lift, squat, to improve pain in low back with IADLS.     Goal status: In progress   PLAN:  PT FREQUENCY: 1-2x/week  PT DURATION: 6 weeks  PLANNED INTERVENTIONS: Therapeutic exercises, Therapeutic activity, Neuromuscular re-education, Balance training, Gait training, Patient/Family education, Self Care,  Joint mobilization, Joint manipulation, Stair training, Orthotic/Fit training, DME instructions, Dry Needling, Electrical stimulation, Spinal manipulation, Spinal mobilization, Cryotherapy, Moist heat, Taping, Traction, Ultrasound, Ionotophoresis 4mg /ml Dexamethasone , and Manual therapy.  PLAN FOR NEXT SESSION:   Tinnie Don, PT, DPT 11:28 AM  06/17/24

## 2024-06-17 ENCOUNTER — Other Ambulatory Visit: Payer: Self-pay | Admitting: Physician Assistant

## 2024-06-17 ENCOUNTER — Encounter: Payer: Self-pay | Admitting: Physical Therapy

## 2024-07-21 ENCOUNTER — Encounter: Admitting: Physical Therapy

## 2024-07-29 ENCOUNTER — Encounter: Payer: Self-pay | Admitting: Physical Therapy

## 2024-07-29 ENCOUNTER — Ambulatory Visit (INDEPENDENT_AMBULATORY_CARE_PROVIDER_SITE_OTHER): Admitting: Physical Therapy

## 2024-07-29 DIAGNOSIS — M25551 Pain in right hip: Secondary | ICD-10-CM

## 2024-07-29 DIAGNOSIS — M5459 Other low back pain: Secondary | ICD-10-CM
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
## 2024-08-04 ENCOUNTER — Other Ambulatory Visit: Payer: Self-pay | Admitting: Physician Assistant

## 2024-09-15 DIAGNOSIS — Z01419 Encounter for gynecological examination (general) (routine) without abnormal findings: Secondary | ICD-10-CM | POA: Diagnosis not present

## 2024-09-15 DIAGNOSIS — Z6832 Body mass index (BMI) 32.0-32.9, adult: Secondary | ICD-10-CM | POA: Diagnosis not present

## 2024-09-15 DIAGNOSIS — Z124 Encounter for screening for malignant neoplasm of cervix: Secondary | ICD-10-CM | POA: Diagnosis not present

## 2024-09-15 DIAGNOSIS — Z1151 Encounter for screening for human papillomavirus (HPV): Secondary | ICD-10-CM | POA: Diagnosis not present

## 2024-10-24 ENCOUNTER — Encounter: Payer: Self-pay | Admitting: Physician Assistant

## 2024-10-25 MED ORDER — ZEPBOUND 5 MG/0.5ML ~~LOC~~ SOLN: 5.0000 mg | SUBCUTANEOUS | 1 refills | Status: AC

## 2024-10-29 ENCOUNTER — Other Ambulatory Visit: Payer: Self-pay | Admitting: Physician Assistant
